# Patient Record
Sex: Male | Born: 2000 | Race: White | Hispanic: No | Marital: Single | Smoking: Never smoker
Health system: Southern US, Community
[De-identification: ages and names within clinical notes are randomized; demographics above are authoritative.]

## PROBLEM LIST (undated history)

## (undated) DIAGNOSIS — U071 COVID-19: Secondary | ICD-10-CM

## (undated) DIAGNOSIS — F419 Anxiety disorder, unspecified: Secondary | ICD-10-CM

## (undated) HISTORY — PX: ELBOW SURGERY: SHX618

## (undated) HISTORY — DX: Anxiety disorder, unspecified: F41.9

---
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
## 2018-05-13 ENCOUNTER — Telehealth: Payer: Self-pay | Admitting: Pediatrics

## 2018-05-13 NOTE — Telephone Encounter (Signed)
[employer] sports form on Dr Eastman Kodakamgoolam's desk

## 2018-05-14 ENCOUNTER — Other Ambulatory Visit: Payer: Self-pay | Admitting: Pediatrics

## 2018-05-14 NOTE — Telephone Encounter (Signed)
Sports form filled 

## 2018-12-30 ENCOUNTER — Encounter: Payer: Self-pay | Admitting: Pediatrics

## 2018-12-30 ENCOUNTER — Ambulatory Visit (INDEPENDENT_AMBULATORY_CARE_PROVIDER_SITE_OTHER): Payer: Commercial Managed Care - PPO | Admitting: Pediatrics

## 2018-12-30 ENCOUNTER — Other Ambulatory Visit: Payer: Self-pay

## 2018-12-30 DIAGNOSIS — Z23 Encounter for immunization: Secondary | ICD-10-CM | POA: Diagnosis not present

## 2018-12-30 NOTE — Progress Notes (Signed)
Discussed with parent about Men B vaccine--parent advised of recommendation and literature given to update parent concerning indications and use of Men B. Parent verbalized understanding. Second dose given.

## 2019-04-05 ENCOUNTER — Emergency Department (HOSPITAL_BASED_OUTPATIENT_CLINIC_OR_DEPARTMENT_OTHER)
Admission: EM | Admit: 2019-04-05 | Discharge: 2019-04-06 | Disposition: A | Discharge status: Home / Self Care | Payer: Commercial Managed Care - PPO | Attending: Emergency Medicine | Admitting: Emergency Medicine

## 2019-04-05 ENCOUNTER — Encounter (HOSPITAL_BASED_OUTPATIENT_CLINIC_OR_DEPARTMENT_OTHER): Payer: Self-pay | Admitting: *Deleted

## 2019-04-05 ENCOUNTER — Other Ambulatory Visit: Payer: Self-pay

## 2019-04-05 DIAGNOSIS — R945 Abnormal results of liver function studies: Secondary | ICD-10-CM | POA: Diagnosis not present

## 2019-04-05 DIAGNOSIS — B349 Viral infection, unspecified: Secondary | ICD-10-CM | POA: Diagnosis not present

## 2019-04-05 DIAGNOSIS — R112 Nausea with vomiting, unspecified: Secondary | ICD-10-CM | POA: Diagnosis present

## 2019-04-05 DIAGNOSIS — R7989 Other specified abnormal findings of blood chemistry: Secondary | ICD-10-CM

## 2019-04-05 DIAGNOSIS — Z79899 Other long term (current) drug therapy: Secondary | ICD-10-CM | POA: Insufficient documentation

## 2019-04-05 MED ORDER — ONDANSETRON HCL 4 MG/2ML IJ SOLN
4.0000 mg | Freq: Once | INTRAMUSCULAR | Status: AC
  Administered 2019-04-06: 4 mg via INTRAVENOUS
  Filled 2019-04-05: qty 2

## 2019-04-05 NOTE — ED Triage Notes (Signed)
Pt reports generalized abdominal pain since last week with diarrhea and vomiting. He was seen at Urgent Care today and told that he was constipated

## 2019-04-05 NOTE — ED Notes (Signed)
Reports 2 doses miralax and another laxative that he cannot recall. States 2 doses of "melting laxatives." Reports taking all these medications today.

## 2019-04-06 ENCOUNTER — Emergency Department (HOSPITAL_BASED_OUTPATIENT_CLINIC_OR_DEPARTMENT_OTHER): Payer: Commercial Managed Care - PPO

## 2019-04-06 LAB — CBC WITH DIFFERENTIAL/PLATELET
Abs Immature Granulocytes: 0.03 10*3/uL (ref 0.00–0.07)
Basophils Absolute: 0 10*3/uL (ref 0.0–0.1)
Basophils Relative: 1 %
Eosinophils Absolute: 0 10*3/uL (ref 0.0–0.5)
Eosinophils Relative: 0 %
HCT: 47.7 % (ref 39.0–52.0)
Hemoglobin: 16.1 g/dL (ref 13.0–17.0)
Immature Granulocytes: 0 %
Lymphocytes Relative: 15 %
Lymphs Abs: 1.2 10*3/uL (ref 0.7–4.0)
MCH: 29.4 pg (ref 26.0–34.0)
MCHC: 33.8 g/dL (ref 30.0–36.0)
MCV: 87.2 fL (ref 80.0–100.0)
Monocytes Absolute: 1.4 10*3/uL — ABNORMAL HIGH (ref 0.1–1.0)
Monocytes Relative: 17 %
Neutro Abs: 5.6 10*3/uL (ref 1.7–7.7)
Neutrophils Relative %: 67 %
Platelets: 247 10*3/uL (ref 150–400)
RBC: 5.47 MIL/uL (ref 4.22–5.81)
RDW: 12.4 % (ref 11.5–15.5)
WBC: 8.3 10*3/uL (ref 4.0–10.5)
nRBC: 0 % (ref 0.0–0.2)

## 2019-04-06 LAB — URINALYSIS, ROUTINE W REFLEX MICROSCOPIC
Bilirubin Urine: NEGATIVE
Glucose, UA: NEGATIVE mg/dL
Hgb urine dipstick: NEGATIVE
Ketones, ur: NEGATIVE mg/dL
Leukocytes,Ua: NEGATIVE
Nitrite: NEGATIVE
Protein, ur: NEGATIVE mg/dL
Specific Gravity, Urine: >1.03 — ABNORMAL HIGH (ref 1.005–1.030)
pH: 5.5 (ref 5.0–8.0)

## 2019-04-06 LAB — COMPREHENSIVE METABOLIC PANEL
ALT: 88 U/L — ABNORMAL HIGH (ref 0–44)
AST: 44 U/L — ABNORMAL HIGH (ref 15–41)
Albumin: 4.7 g/dL (ref 3.5–5.0)
Alkaline Phosphatase: 59 U/L (ref 38–126)
Anion gap: 12 (ref 5–15)
BUN: 12 mg/dL (ref 6–20)
CO2: 24 mmol/L (ref 22–32)
Calcium: 9.1 mg/dL (ref 8.9–10.3)
Chloride: 101 mmol/L (ref 98–111)
Creatinine, Ser: 1.17 mg/dL (ref 0.61–1.24)
GFR calc Af Amer: >60 mL/min (ref >60)
GFR calc non Af Amer: >60 mL/min (ref >60)
Glucose, Bld: 102 mg/dL — ABNORMAL HIGH (ref 70–99)
Potassium: 3.6 mmol/L (ref 3.5–5.1)
Sodium: 137 mmol/L (ref 135–145)
Total Bilirubin: 0.4 mg/dL (ref 0.3–1.2)
Total Protein: 7.6 g/dL (ref 6.5–8.1)

## 2019-04-06 LAB — LIPASE, BLOOD: Lipase: 27 U/L (ref 11–51)

## 2019-04-06 MED ORDER — ONDANSETRON 4 MG PO TBDP: 4.0000 mg | ORAL_TABLET | Freq: Three times a day (TID) | ORAL | 0 refills | Status: DC | PRN

## 2019-04-06 NOTE — Discharge Instructions (Addendum)
Seen today for abdominal pain and vomiting.  Your work-up is largely reassuring.  Your LFTs are very mildly elevated but this can be seen in a viral illness.  Take Zofran as needed for vomiting.  Follow-up with your primary doctor for recheck liver function test.

## 2019-04-06 NOTE — ED Provider Notes (Signed)
[facility] EMERGENCY DEPARTMENT Provider Note   CSN: [id number] Arrival date & time: 04/05/19  2312    History   Chief Complaint Chief Complaint  Patient presents with  . Abdominal Pain    HPI Lucas Sherman is a 18 y.o. male.     HPI  This is a 18 year old male who presents with abdominal pain, nausea, vomiting.  Patient reports 3 to 4-day history of crampy diffuse abdominal pain.  States it is worse after eating.  He states he has not eaten much.  He has had episodes of nonbilious, nonbloody emesis.  He states he has not had a normal bowel movement in 3 to 4 days.  He was seen in urgent care earlier today and told that he had constipation.  7 out of 10.  Patient reports he has taken multiple laxatives but has not had a good bowel movement.  He denies urinary symptoms.  Had not noted any chills or fever but was noted to have a temperature of 100.5 upon arrival.  No known sick contacts.  Denies upper respiratory symptoms, cough, congestion.  History reviewed. No pertinent past medical history.  Patient Active Problem List   Diagnosis Date Noted  . Sore throat 11/15/2017  . Viral syndrome 11/15/2017  . Encounter for routine child health examination without abnormal findings 10/26/2016  . BMI (body mass index), pediatric, > 99% for age 71/24/2017  . BMI (body mass index), pediatric, 95-99% for age 18/11/2013  . Well child check 08/04/2013  . Anxiety disorder 02/03/2013  . Acne vulgaris 07/22/2012  . 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 ELBOW SURGERY          Home Medications    Prior to Admission medications   Medication Sig Start Date End Date Taking? Authorizing Provider  Adapalene-Benzoyl Peroxide (EPIDUO) 0.1-2.5 % gel Apply 1 application topically at bedtime. 07/07/15   Georgiann Hahnamgoolam, Andres, MD  clobetasol ointment (TEMOVATE) 0.05 %  05/22/12   [provider]  escitalopram  (LEXAPRO) 20 MG tablet TAKE 1 TABLET DAILY 05/14/18   Georgiann Hahnamgoolam, Andres, MD  fluticasone (FLONASE) 50 MCG/ACT nasal spray Place 1 spray into both nostrils daily. 07/07/15 07/06/16  Georgiann Hahnamgoolam, Andres, MD  glycopyrrolate (ROBINUL) 1 MG tablet  05/20/12   [provider]  ondansetron (ZOFRAN ODT) 4 MG disintegrating tablet Take 1 tablet (4 mg total) by mouth every 8 (eight) hours as needed. 04/06/19   , Mayer Maskerourtney F, MD  TACLONEX external suspension  05/21/12   [provider]    Family History Family History  Problem Relation Age of Onset  . Arthritis Neg Hx   . Asthma Neg Hx   . Alcohol abuse Neg Hx   . Birth defects Neg Hx   . Cancer Neg Hx   . COPD Neg Hx   . Depression Neg Hx   . Diabetes Neg Hx   . Drug abuse Neg Hx   . Early death Neg Hx   . Hearing loss Neg Hx   . Heart disease Neg Hx   . Hyperlipidemia Neg Hx   . Learning disabilities Neg Hx   . Kidney disease Neg Hx   . Hypertension Neg Hx   . Mental illness Neg Hx   . Miscarriages / Stillbirths Neg Hx   . Mental retardation Neg Hx   . Stroke Neg Hx   . Vision loss Neg Hx   . Varicose Veins Neg Hx  Social History Social History   Tobacco Use  . Smoking status: Never Smoker  . Smokeless tobacco: Never Used  Substance Use Topics  . Alcohol use: Yes    Comment: occasional  . Drug use: Never     Allergies   Patient has no known allergies.   Review of Systems Review of Systems  Constitutional: Positive for fever.  Respiratory: Negative for cough and shortness of breath.   Cardiovascular: Negative for chest pain.  Gastrointestinal: Positive for abdominal pain, constipation, nausea and vomiting. Negative for diarrhea.  Genitourinary: Negative for dysuria.  Neurological: Negative for weakness and light-headedness.  All other systems reviewed and are negative.    Physical Exam Updated Vital Signs BP (!) 143/71 (BP Location: Right Arm)   Pulse 99   Temp (!) 100.5 F (38.1 C) (Oral)    Resp 20   Ht 1.753 m (5\' 9" )   Wt 99.8 kg   SpO2 100%   BMI 32.49 kg/m   Physical Exam Vitals signs and nursing note reviewed.  Constitutional:      Appearance: He is well-developed.     Comments: Overweight  HENT:     Head: Normocephalic and atraumatic.     Mouth/Throat:     Mouth: Mucous membranes are moist.  Eyes:     Pupils: Pupils are equal, round, and reactive to light.  Neck:     Musculoskeletal: Neck supple.  Cardiovascular:     Rate and Rhythm: Normal rate and regular rhythm.     Heart sounds: Normal heart sounds. No murmur.  Pulmonary:     Effort: Pulmonary effort is normal. No respiratory distress.     Breath sounds: Normal breath sounds. No wheezing.  Abdominal:     General: Bowel sounds are normal.     Palpations: Abdomen is soft.     Tenderness: There is no abdominal tenderness. There is no guarding or rebound.  Lymphadenopathy:     Cervical: No cervical adenopathy.  Skin:    General: Skin is warm and dry.  Neurological:     Mental Status: He is alert and oriented to person, place, and time.  Psychiatric:        Mood and Affect: Mood normal.      ED Treatments / Results  Labs (all labs ordered are listed, but only abnormal results are displayed) Labs Reviewed  CBC WITH DIFFERENTIAL/PLATELET - Abnormal; Notable for the following components:      Result Value   Monocytes Absolute 1.4 (*)    All other components within normal limits  COMPREHENSIVE METABOLIC PANEL - Abnormal; Notable for the following components:   Glucose, Bld 102 (*)    AST 44 (*)    ALT 88 (*)    All other components within normal limits  URINALYSIS, ROUTINE W REFLEX MICROSCOPIC - Abnormal; Notable for the following components:   Specific Gravity, Urine >1.030 (*)    All other components within normal limits  LIPASE, BLOOD    EKG None  Radiology Dg Abdomen 1 View  Result Date: 04/06/2019 CLINICAL DATA:  18 y/o M; 3 days of abdominal pain. Last normal bowel movement was 5  days ago. EXAM: ABDOMEN - 1 VIEW COMPARISON:  None. FINDINGS: The bowel gas pattern is normal. No radio-opaque calculi or other significant radiographic abnormality are seen. Mild dextrocurvature of the lumbar spine. IMPRESSION: Negative. Electronically Signed   By: Kristine Garbe M.D.   On: 04/06/2019 01:21    Procedures Procedures (including critical care time)  Medications  Ordered in ED Medications  ondansetron (ZOFRAN) injection 4 mg (4 mg Intravenous Given 04/06/19 0010)     Initial Impression / Assessment and Plan / ED Course  I have reviewed the triage vital signs and the nursing notes.  Pertinent labs & imaging results that were available during my care of the patient were reviewed by me and considered in my medical decision making (see chart for details).        Patient presents with abdominal pain, nausea, vomiting.  Told he was constipated and has taken laxative.  He is overall nontoxic-appearing.  Temperature is 100.5.  Denies any other infectious symptoms.  His abdominal exam is fairly benign.  No focal tenderness or signs of peritonitis.  Considerations include viral etiology, gastritis, pancreatitis, cholecystitis, early appendicitis.  Lab work obtained.  Patient was given Zofran.  No significant leukocytosis.  CMP notable for very mildly elevated AST and LFT.  This is non-specific.  X-ray does not show any evidence of significant constipation.  Patient did have 1 diarrhea bowel movement while in the emergency department and reports that he feels somewhat better.  Given constellation of symptoms, I suspect this is most likely viral in nature.  His abdominal exam does not have any focal tenderness to suggest cholecystitis or appendicitis at this time.  I discussed this with the patient.  Recommend supportive measures and follow-up for repeat LFTs.  After history, exam, and medical workup I feel the patient has been appropriately medically screened and is safe for  discharge home. Pertinent diagnoses were discussed with the patient. Patient was given return precautions.  Final Clinical Impressions(s) / ED Diagnoses   Final diagnoses:  Non-intractable vomiting with nausea, unspecified vomiting type  Elevated LFTs  Viral syndrome    ED Discharge Orders         Ordered    ondansetron (ZOFRAN ODT) 4 MG disintegrating tablet  Every 8 hours PRN     04/06/19 0140           , Mayer Maskerourtney F, MD 04/06/19 0147

## 2019-04-08 ENCOUNTER — Emergency Department (HOSPITAL_BASED_OUTPATIENT_CLINIC_OR_DEPARTMENT_OTHER): Payer: Commercial Managed Care - PPO

## 2019-04-08 ENCOUNTER — Other Ambulatory Visit: Payer: Self-pay

## 2019-04-08 ENCOUNTER — Emergency Department (HOSPITAL_BASED_OUTPATIENT_CLINIC_OR_DEPARTMENT_OTHER)
Admission: EM | Admit: 2019-04-08 | Discharge: 2019-04-08 | Disposition: A | Discharge status: Home / Self Care | Payer: Commercial Managed Care - PPO | Attending: Emergency Medicine | Admitting: Emergency Medicine

## 2019-04-08 DIAGNOSIS — R112 Nausea with vomiting, unspecified: Secondary | ICD-10-CM | POA: Insufficient documentation

## 2019-04-08 DIAGNOSIS — R1084 Generalized abdominal pain: Secondary | ICD-10-CM | POA: Insufficient documentation

## 2019-04-08 DIAGNOSIS — R109 Unspecified abdominal pain: Secondary | ICD-10-CM | POA: Diagnosis present

## 2019-04-08 DIAGNOSIS — R1011 Right upper quadrant pain: Secondary | ICD-10-CM | POA: Diagnosis not present

## 2019-04-08 LAB — CBC WITH DIFFERENTIAL/PLATELET
Abs Immature Granulocytes: 0.02 10*3/uL (ref 0.00–0.07)
Basophils Absolute: 0 10*3/uL (ref 0.0–0.1)
Basophils Relative: 0 %
Eosinophils Absolute: 0 10*3/uL (ref 0.0–0.5)
Eosinophils Relative: 0 %
HCT: 46.5 % (ref 39.0–52.0)
Hemoglobin: 15.7 g/dL (ref 13.0–17.0)
Immature Granulocytes: 0 %
Lymphocytes Relative: 17 %
Lymphs Abs: 1.1 10*3/uL (ref 0.7–4.0)
MCH: 29.3 pg (ref 26.0–34.0)
MCHC: 33.8 g/dL (ref 30.0–36.0)
MCV: 86.8 fL (ref 80.0–100.0)
Monocytes Absolute: 0.6 10*3/uL (ref 0.1–1.0)
Monocytes Relative: 10 %
Neutro Abs: 4.5 10*3/uL (ref 1.7–7.7)
Neutrophils Relative %: 73 %
Platelets: 255 10*3/uL (ref 150–400)
RBC: 5.36 MIL/uL (ref 4.22–5.81)
RDW: 12 % (ref 11.5–15.5)
WBC: 6.3 10*3/uL (ref 4.0–10.5)
nRBC: 0 % (ref 0.0–0.2)

## 2019-04-08 LAB — COMPREHENSIVE METABOLIC PANEL
ALT: 57 U/L — ABNORMAL HIGH (ref 0–44)
AST: 29 U/L (ref 15–41)
Albumin: 4.6 g/dL (ref 3.5–5.0)
Alkaline Phosphatase: 51 U/L (ref 38–126)
Anion gap: 9 (ref 5–15)
BUN: 8 mg/dL (ref 6–20)
CO2: 26 mmol/L (ref 22–32)
Calcium: 9 mg/dL (ref 8.9–10.3)
Chloride: 102 mmol/L (ref 98–111)
Creatinine, Ser: 1.07 mg/dL (ref 0.61–1.24)
GFR calc Af Amer: >60 mL/min (ref >60)
GFR calc non Af Amer: >60 mL/min (ref >60)
Glucose, Bld: 100 mg/dL — ABNORMAL HIGH (ref 70–99)
Potassium: 3.6 mmol/L (ref 3.5–5.1)
Sodium: 137 mmol/L (ref 135–145)
Total Bilirubin: 0.7 mg/dL (ref 0.3–1.2)
Total Protein: 7.4 g/dL (ref 6.5–8.1)

## 2019-04-08 LAB — URINALYSIS, ROUTINE W REFLEX MICROSCOPIC
Bilirubin Urine: NEGATIVE
Glucose, UA: NEGATIVE mg/dL
Hgb urine dipstick: NEGATIVE
Ketones, ur: NEGATIVE mg/dL
Leukocytes,Ua: NEGATIVE
Nitrite: NEGATIVE
Protein, ur: NEGATIVE mg/dL
Specific Gravity, Urine: 1.02 (ref 1.005–1.030)
pH: 6.5 (ref 5.0–8.0)

## 2019-04-08 LAB — LIPASE, BLOOD: Lipase: 25 U/L (ref 11–51)

## 2019-04-08 MED ORDER — LACTATED RINGERS IV BOLUS
1000.0000 mL | Freq: Once | INTRAVENOUS | Status: AC
  Administered 2019-04-08: 1000 mL via INTRAVENOUS

## 2019-04-08 MED ORDER — DIPHENHYDRAMINE HCL 50 MG/ML IJ SOLN
25.0000 mg | Freq: Once | INTRAMUSCULAR | Status: AC
  Administered 2019-04-08: 25 mg via INTRAVENOUS
  Filled 2019-04-08: qty 1

## 2019-04-08 MED ORDER — HYDROXYZINE HCL 25 MG PO TABS
25.0000 mg | ORAL_TABLET | Freq: Once | ORAL | Status: AC
  Administered 2019-04-08: 25 mg via ORAL
  Filled 2019-04-08: qty 1

## 2019-04-08 MED ORDER — FAMOTIDINE IN NACL 20-0.9 MG/50ML-% IV SOLN
20.0000 mg | Freq: Once | INTRAVENOUS | Status: AC
  Administered 2019-04-08: 20 mg via INTRAVENOUS
  Filled 2019-04-08: qty 50

## 2019-04-08 MED ORDER — METOCLOPRAMIDE HCL 5 MG/ML IJ SOLN
10.0000 mg | Freq: Once | INTRAMUSCULAR | Status: AC
  Administered 2019-04-08: 10 mg via INTRAVENOUS
  Filled 2019-04-08: qty 2

## 2019-04-08 NOTE — Discharge Instructions (Addendum)
Please see the information and instructions below regarding your visit.  Your diagnoses today include:  1. Non-intractable vomiting with nausea, unspecified vomiting type   2. RUQ pain   3. Generalized abdominal pain     Your exam and testing today is reassuring that there is not a condition causing your abdominal pain that we immediately need to intervene on at this time.   Abdominal (belly) pain can be caused by many things. Your caregiver performed an examination and possibly ordered blood/urine tests and imaging (CT scan, x-rays, ultrasound). Many cases can be observed and treated at home after initial evaluation in the emergency department. Even though you are being discharged home, abdominal pain can be unpredictable. Therefore, you need a repeated exam if your pain does not resolve, returns, or worsens. Most patients with abdominal pain don't have to be admitted to the hospital or have surgery, but serious problems like appendicitis and gallbladder attacks can start out as nonspecific pain. Many abdominal conditions cannot be diagnosed in one visit, so follow-up evaluations are very important.  Tests performed today include: Blood counts and electrolytes Blood tests to check liver and kidney function Blood tests to check pancreas function Urine test to look for infection and pregnancy (in women) Vital signs. See below for your results today.   See side panel of your discharge paperwork for testing performed today. Vital signs are listed at the bottom of these instructions.   I will call you regarding the results of the ultrasound.  If there is evidence of a gallbladder infection, I will recommend that you go to [facility].  Medications prescribed:    Take any prescribed medications only as prescribed, and any over the counter medications only as directed on the packaging.  Continue to take home Zofran.  Home care instructions:  Try eating, but start with foods that have  a lot of fluid in them. Good examples are soup, Jell-O, and popsicles. If you do OK with those foods, you can try soft, bland foods, such as plain yogurt. Foods that are high in carbohydrates ("carbs"), like bread or saltine crackers, can help settle your stomach. Some people also find that ginger helps with nausea. You should avoid foods that have a lot of fat in them. They can make nausea worse. Call your doctor if your symptoms come back when you try to eat.  Please follow any educational materials contained in this packet.   Follow-up instructions: Please follow-up with your primary care provider.  You only need to make an appointment with [facility] [place] [facility] if your ultrasound shows gallstones.  Return instructions:  Please return to the Emergency Department if you experience worsening symptoms.  SEEK IMMEDIATE MEDICAL ATTENTION IF: The pain does not go away or becomes severe  A temperature above 101F develops  Repeated vomiting occurs (multiple episodes)  The pain becomes localized to portions of the abdomen. The right side could possibly be appendicitis. In an adult, the left lower portion of the abdomen could be colitis or diverticulitis.  Blood is being passed in stools or vomit (bright red or black tarry stools)  You develop chest pain, difficulty breathing, dizziness or fainting, or become confused, poorly responsive, or inconsolable (young children) If you have any other emergent concerns regarding your health  Additional Information:   Your vital signs today were: BP 133/81 (BP Location: Right Arm)    Pulse 75    Temp 98.3 F (36.8 C) (Oral)    Resp 18  Ht 5\' 9"  (1.753 m)    Wt 100.7 kg    SpO2 100%    BMI 32.78 kg/m  If your blood pressure (BP) was elevated on multiple readings during this visit above 130 for the top number or above 80 for the bottom number, please have this repeated by your primary care provider within one month. --------------  Thank you for  allowing [place] to participate in your care today.

## 2019-04-08 NOTE — ED Provider Notes (Signed)
[facility] EMERGENCY DEPARTMENT Provider Note   CSN: [id number] Arrival date & time: 04/08/19  1754     History   Chief Complaint Chief Complaint  Patient presents with  . Abdominal Pain    HPI Lucas Sherman is a 18 y.o. male.     HPI   Patient is a 18 year old male with a past medical history of generalized anxiety, obesity, presenting for generalized abdominal pain, nausea, vomiting.  Patient reports that his symptoms have been present for 1 week.  He had a fever 2 days ago that is since resolved.  Pain has now localized more to the right upper quadrant.  It is intermittent and sharp.  He cannot identify any specific thing that makes it worse or better.  He reports that he can keep fluids down but foods make him vomit.  Denies bilious or bloody vomiting.  Patient reports soft stool without diarrhea.  Denies melena or hematochezia.  No abdominal surgical history.  Patient denies any known exposures to individuals with COVID-19.  No past medical history on file.  Patient Active Problem List   Diagnosis Date Noted  . Sore throat 11/15/2017  . Viral syndrome 11/15/2017  . Encounter for routine child health examination without abnormal findings 10/26/2016  . BMI (body mass index), pediatric, > 99% for age 27/24/2017  . BMI (body mass index), pediatric, 95-99% for age 40/11/2013  . Well child check 08/04/2013  . Anxiety disorder 02/03/2013  . Acne vulgaris 07/22/2012  . 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 ELBOW SURGERY          Home Medications    Prior to Admission medications   Medication Sig Start Date End Date Taking? Authorizing Provider  Adapalene-Benzoyl Peroxide (EPIDUO) 0.1-2.5 % gel Apply 1 application topically at bedtime. 07/07/15   Marcha Solders, MD  clobetasol ointment (TEMOVATE) 0.05 %  05/22/12   [provider]  escitalopram (LEXAPRO) 20 MG tablet TAKE 1  TABLET DAILY 05/14/18   Marcha Solders, MD  fluticasone (FLONASE) 50 MCG/ACT nasal spray Place 1 spray into both nostrils daily. 07/07/15 07/06/16  Marcha Solders, MD  glycopyrrolate (ROBINUL) 1 MG tablet  05/20/12   [provider]  ondansetron (ZOFRAN ODT) 4 MG disintegrating tablet Take 1 tablet (4 mg total) by mouth every 8 (eight) hours as needed. 04/06/19   Horton, Barbette Hair, MD  TACLONEX external suspension  05/21/12   [provider]    Family History Family History  Problem Relation Age of Onset  . Arthritis Neg Hx   . Asthma Neg Hx   . Alcohol abuse Neg Hx   . Birth defects Neg Hx   . Cancer Neg Hx   . COPD Neg Hx   . Depression Neg Hx   . Diabetes Neg Hx   . Drug abuse Neg Hx   . Early death Neg Hx   . Hearing loss Neg Hx   . Heart disease Neg Hx   . Hyperlipidemia Neg Hx   . Learning disabilities Neg Hx   . Kidney disease Neg Hx   . Hypertension Neg Hx   . Mental illness Neg Hx   . Miscarriages / Stillbirths Neg Hx   . Mental retardation Neg Hx   . Stroke Neg Hx   . Vision loss Neg Hx   . Varicose Veins Neg Hx     Social History Social History   Tobacco Use  .  Smoking status: Never Smoker  . Smokeless tobacco: Never Used  Substance Use Topics  . Alcohol use: Yes    Comment: occasional  . Drug use: Never     Allergies   Patient has no known allergies.   Review of Systems Review of Systems  Constitutional: Negative for chills and fever.  HENT: Negative for congestion, rhinorrhea, sinus pain and sore throat.   Eyes: Negative for visual disturbance.  Respiratory: Negative for cough, chest tightness and shortness of breath.   Cardiovascular: Negative for chest pain.  Gastrointestinal: Positive for abdominal pain, nausea and vomiting. Negative for constipation and diarrhea.  Genitourinary: Negative for dysuria and flank pain.  Musculoskeletal: Negative for back pain and myalgias.  Skin: Negative for rash.  Neurological: Negative  for dizziness, syncope, light-headedness and headaches.     Physical Exam Updated Vital Signs BP 132/74 (BP Location: Right Arm)   Pulse 72   Temp 98.3 F (36.8 C) (Oral)   Resp 16   Ht 5\' 9"  (1.753 m)   Wt 100.7 kg   SpO2 99%   BMI 32.78 kg/m   Physical Exam Vitals signs and nursing note reviewed.  Constitutional:      General: He is not in acute distress.    Appearance: He is well-developed.  HENT:     Head: Normocephalic and atraumatic.  Eyes:     Conjunctiva/sclera: Conjunctivae normal.     Pupils: Pupils are equal, round, and reactive to light.  Neck:     Musculoskeletal: Normal range of motion and neck supple.  Cardiovascular:     Rate and Rhythm: Normal rate and regular rhythm.     Heart sounds: S1 normal and S2 normal. No murmur.  Pulmonary:     Effort: Pulmonary effort is normal.     Breath sounds: Normal breath sounds. No wheezing or rales.  Abdominal:     General: Bowel sounds are normal. There is no distension.     Palpations: Abdomen is soft.     Tenderness: There is generalized abdominal tenderness and tenderness in the right upper quadrant and epigastric area. There is no guarding.  Musculoskeletal: Normal range of motion.        General: No deformity.  Lymphadenopathy:     Cervical: No cervical adenopathy.  Skin:    General: Skin is warm and dry.     Findings: No erythema or rash.  Neurological:     Mental Status: He is alert.     Comments: Cranial nerves grossly intact. Patient moves extremities symmetrically and with good coordination.  Psychiatric:        Behavior: Behavior normal.        Thought Content: Thought content normal.        Judgment: Judgment normal.      ED Treatments / Results  Labs (all labs ordered are listed, but only abnormal results are displayed) Labs Reviewed  COMPREHENSIVE METABOLIC PANEL - Abnormal; Notable for the following components:      Result Value   Glucose, Bld 100 (*)    ALT 57 (*)    All other  components within normal limits  CBC WITH DIFFERENTIAL/PLATELET  LIPASE, BLOOD  URINALYSIS, ROUTINE W REFLEX MICROSCOPIC    EKG None  Radiology [place] Abdomen Limited Ruq  Result Date: 04/08/2019 CLINICAL DATA:  Right upper quadrant pain EXAM: ULTRASOUND ABDOMEN LIMITED RIGHT UPPER QUADRANT COMPARISON:  None. FINDINGS: Gallbladder: No gallstones or wall thickening visualized. There is no pericholecystic fluid. No sonographic Murphy sign noted by  sonographer. Common bile duct: Diameter: 2 mm. No intrahepatic or extrahepatic biliary duct dilatation. Liver: No focal lesion identified. Within normal limits in parenchymal echogenicity. Portal vein is patent on color Doppler imaging with normal direction of blood flow towards the liver. IMPRESSION: Study within normal limits. Electronically Signed   By: Bretta BangWilliam  Woodruff III M.D.   On: 04/08/2019 20:59    Procedures Procedures (including critical care time)  Medications Ordered in ED Medications  lactated ringers bolus 1,000 mL ( Intravenous Stopped 04/08/19 2000)  metoCLOPramide (REGLAN) injection 10 mg (10 mg Intravenous Given 04/08/19 1902)  diphenhydrAMINE (BENADRYL) injection 25 mg (25 mg Intravenous Given 04/08/19 1901)  famotidine (PEPCID) IVPB 20 mg premix ( Intravenous Stopped 04/08/19 1939)  hydrOXYzine (ATARAX/VISTARIL) tablet 25 mg (25 mg Oral Given 04/08/19 1948)     Initial Impression / Assessment and Plan / ED Course  I have reviewed the triage vital signs and the nursing notes.  Pertinent labs & imaging results that were available during my care of the patient were reviewed by me and considered in my medical decision making (see chart for details).  Clinical Course as of Apr 07 2209  Wed Apr 08, 2019  2036 Patient reports he cannot stay any longer.  He reports that he feels much better from his abdominal complaints however he is very jittery.  Reports that the Atarax did not help.  Offered anxiolysis, however he reports that he would  like to go home.  He talk with his father is amenable to this plan.  I will notify him via phone of the results of his right upper quadrant ultrasound.  He was instructed if there is an infection in the gallbladder, I recommend that he [facility].  He will be notified.   [AM]  2154 Attempted to call pt and give results. Message not containing health information.    [AM]    Clinical Course User Index [AM] Elisha PonderMurray, Caley Volkert B, PA-C      This is a well-appearing 18 year old male with past medical history of anxiety, obesity presenting for generalized abdominal pains localizing to the right upper quadrant, nausea, and vomiting.  Patient was febrile 2 days ago however this is resolved without antipyretics.  Differential diagnosis includes cholecystitis, cholelithiasis, gastroenteritis, cannabis hyperemesis, cyclical vomiting syndrome, IBS, IBD.  Work-up demonstrated leukocytosis.  Patient has improvement in transaminitis seen a couple days ago.  He only has a slight elevation ALT at 57.  Urinalysis clear without evidence of infection.  Lipase not elevated.  Right upper quadrant ultrasound within normal limits.  He continues have benign abdominal exam.    Patient reports feeling much better after fluids, Pepcid, antiemetics, however he did report significant feelings of anxiousness after receiving Reglan despite the Benadryl and Atarax administration.  Tolerating PO. He reports he could not wait for the results of his right upper quad ultrasound.  Attempted to reach patient x1 to give normal results.  He was also instructed to follow-up on my chart.  Please instructed return for any new or worsening symptoms.  Patient is in understanding and agrees with the plan of care.  Final Clinical Impressions(s) / ED Diagnoses   Final diagnoses:  RUQ pain  Non-intractable vomiting with nausea, unspecified vomiting type  Generalized abdominal pain    ED Discharge Orders    None       Delia ChimesMurray, Angelik Walls B,  PA-C 04/08/19 2211    Virgina Norfolkuratolo, Adam, DO 04/08/19 2247

## 2019-04-08 NOTE — ED Triage Notes (Signed)
Pt reports abdominal pain generalized cramping.  Was seen for same two days ago, given zofran and discharged home.  Pt states since then pain has been constant, unable to sleep, vomiting intermittently.  Able to keep down fluids well, eating bland foods.  Denies any urination difficulties. Denies fevers.

## 2019-04-08 NOTE — ED Notes (Signed)
Provider at bedside.  Pt aware of need for urine specimen

## 2019-04-21 ENCOUNTER — Ambulatory Visit: Payer: Commercial Managed Care - PPO | Admitting: Psychiatry

## 2019-05-04 ENCOUNTER — Other Ambulatory Visit: Payer: Self-pay

## 2019-05-04 ENCOUNTER — Ambulatory Visit (INDEPENDENT_AMBULATORY_CARE_PROVIDER_SITE_OTHER): Payer: Commercial Managed Care - PPO | Admitting: Psychiatry

## 2019-05-04 ENCOUNTER — Encounter: Payer: Self-pay | Admitting: Psychiatry

## 2019-05-04 VITALS — Ht 67.5 in | Wt 217.0 lb

## 2019-05-04 DIAGNOSIS — F410 Panic disorder [episodic paroxysmal anxiety] without agoraphobia: Secondary | ICD-10-CM | POA: Diagnosis not present

## 2019-05-04 DIAGNOSIS — G2581 Restless legs syndrome: Secondary | ICD-10-CM | POA: Diagnosis not present

## 2019-05-04 DIAGNOSIS — F411 Generalized anxiety disorder: Secondary | ICD-10-CM

## 2019-05-04 MED ORDER — GABAPENTIN 100 MG PO CAPS: 200.0000 mg | ORAL_CAPSULE | Freq: Two times a day (BID) | ORAL | 0 refills | Status: DC

## 2019-05-04 MED ORDER — CLONAZEPAM 0.5 MG PO TABS: 0.5000 mg | ORAL_TABLET | Freq: Three times a day (TID) | ORAL | 0 refills | Status: DC | PRN

## 2019-05-04 NOTE — Progress Notes (Signed)
[facility] MD/PA/NP Initial Note  05/04/2019 11:58 PM Lucas Sherman  MRN:  [id number] PCP: Lucas LennertSarah Weber, PA at [facility] Time spent 45 minutes from [phone]  Chief Complaint:  Chief Complaint    Anxiety; Panic Attack      HPI: Lucas Sherman is seen onsite in office face-to-face individually with consent with epic collateral scheduled by mother who does not attend today but predicted patient's arrival for care similar to younger brother patient reports is doing well now on Klonopin alone having difficulty with at least 4 different antianxiety antidepressants.  Asberry's medication management has been by primary care, and he seems anxious and stressed to be here today talking rapidly stressing issues and conclusions in a pattern that likely occurs with panic and generalized anxiety for him.  He has performance anxiety that dates back to latency years if not earlier.  His anxiety reaches out-of-control proportions in the last 4-5 years with panic of gastrointestinal origin.  He has been to the emergency department July 5th and 8th with intractable vomiting having elevation of ALT to 88 considered viral gastritis now returned to normal with follow-up at [facility] with ALT back to normal 04/27/2019 at 43.  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  He has not been suspected of ADHD or bipolar hypomania in the past.  His anxiety can be depressing for a brief time but depression never becomes sustained or primary.  PCP has recently added Klonopin 0.5 mg twice daily as needed patient taking it twice daily finding some improvement similar to younger brother doing the same.  The patient has been on Lexapro 20 mg every morning for 8 to 10 years finding that he cannot stop it because he gets brain zaps stating it has helped variously at  different times but concluding he would definitely do poorly off of it.  He did not tolerate Pristiq 25 or 50 mg having headaches and other side effects and is generally apprehensive about any other treatment.  Does describe difficulty sleeping stating that he must stretch his legs before getting in bed but his legs will often cause him to arise from bed quickly to take a hot bath to settle his legs.  He has limited sleep time though not always fatigued the next day, though possibly more predisposed to anxiety.  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  He notes he has palpitations on waking in the morning as though pre-panic symptoms.  He emphasizes he has completed high school and is doing reasonably well in college anticipating he can transfer to [employer], [employer] or [employer] to play baseball  and he may be drafted into the pros.  He knows that gastrointestinal problems extended even to infancy crying and spitting up until he was held.  He has no mania, suicidality, psychosis, or delirium.  He has no substance use diathesis.  Visit Diagnosis:    ICD-10-CM   1. Generalized anxiety disorder  F41.1 clonazePAM (KLONOPIN) 0.5 MG tablet  2. Panic disorder  F41.0 gabapentin (NEURONTIN) 100 MG capsule    clonazePAM (KLONOPIN) 0.5 MG tablet  3. Restless legs syndrome  G25.81 gabapentin (NEURONTIN) 100 MG capsule  4.      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  He  has primarily been treated by primary care with Lexapro titrated up to 20 mg every morning having recently been treated with Pristiq 25 or 50 mg daily not tolerated due to headache and other side effects.  He does feel better with Klonopin 0.5 mg twice daily whether as needed as PCP suggests or whether in a prevention way on schedule.  He seeks some modification in treatment for success like his younger  brother.  Past Medical History:  Past Medical History:  Diagnosis Date  . Anxiety   Pustular palmar and plantar psoriasis  Past Surgical History:  Procedure Laterality Date  . ELBOW SURGERY    Lucas Sherman John surgery right elbow as a baseball pitcher  Hepatic Function Panel (04/27/2019 9:32 AM EDT) Hepatic Function Panel (04/27/2019 9:32 AM EDT)  Component Value Ref Range Performed At Pathologist Signature  Total Protein 6.9 6.0 - 8.5 g/dL [facility]   Albumin, Serum 4.8 4.1 - 5.2 g/dL [facility]   Total Bilirubin <0.2 0.0 - 1.2 mg/dL [facility]   Bilirubin, Direct 0.09 0.00 - 0.40 mg/dL [facility]   Alkaline Phosphatase 61 39 - 117 IU/L [facility]   AST 40 0 - 40 IU/L [facility]   ALT (SGPT) 43 0 - 44 IU/L [facility]      Family Psychiatric History: Mother has been treated with Paxil then currently Lexapro for anxiety that started postpartum raising question of associated depressive mood disorder.  Maternal grandmother took Remeron for depression and anxiety. Younger brother has taken Lexapro, Remeron, Pamelor and Paxil without success more recently Wellbutrin only tolerating 150 mg XL possibly discontinued and now Klonopin 0.5 mg twice daily.  There is some depression and alcohol use disorder on paternal side and some alcohol use disorder on the maternal side of the family.  Family History:  Family History  Problem Relation Age of Onset  . Depression Mother   . Anxiety disorder Mother   . Depression Maternal Grandmother   . Anxiety disorder Brother   . Panic disorder Brother   . Depression Other   . Alcohol abuse Other   . Arthritis Neg Hx   . Asthma Neg Hx   . Birth defects Neg Hx   . Cancer Neg Hx   . COPD Neg Hx   . Diabetes Neg Hx   . Drug abuse Neg Hx   . Early death Neg Hx   . Hearing loss Neg Hx   . Heart disease Neg Hx   . Hyperlipidemia Neg Hx   . Learning disabilities Neg Hx   . Kidney disease Neg Hx   . Hypertension Neg Hx   . Mental illness Neg Hx   .  Miscarriages / Stillbirths Neg Hx   . Mental retardation Neg Hx   . Stroke Neg Hx   . Vision loss Neg Hx   . Varicose Veins Neg Hx     Social History: Currently at [employer] after graduating at [employer] and transferred from [employer] having middle school at [employer]. Social History   Socioeconomic History  . Marital status: Single    Spouse name: Not on file  . Number of children: Not on file  . Years of education: 4414  . Highest education level: High school graduate  Occupational History  . Occupation: Seeking pro baseball.career  Social Needs  . Financial resource strain: Not on file  . Food insecurity    Worry: Never true    Inability: Never true  .  Transportation needs    Medical: No    Non-medical: No  Tobacco Use  . Smoking status: Never Smoker  . Smokeless tobacco: Never Used  Substance and Sexual Activity  . Alcohol use: Yes    Comment: occasional  . Drug use: Never  . Sexual activity: Not on file  Lifestyle  . Physical activity    Days per week: Not on file    Minutes per session: Not on file  . Stress: Rather much  Relationships  . Social Herbalist on phone: Not on file    Gets together: Not on file    Attends religious service: Not on file    Active member of club or organization: Not on file    Attends meetings of clubs or organizations: Not on file    Relationship status: Not on file  Other Topics Concern  . 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  He describes restless legs in his sleep patterns having to get up for  a hot bath or stretches to his legs in order to try to get back to sleep.  Despite longstanding Lexapro and new onset Klonopin, he fears and finds instability still in his illness pattern that undermines his performance in baseball and academics.  He leaves for college 05/18/2019 seeking intervention prior to then after PCP has recently started the Klonopin which helped but affecting tolerance side effects if dosed higher to prevent anxiety or panic.    Allergies: No Known Allergies  Metabolic Disorder Labs: No results found for: HGBA1C, MPG No results found for: PROLACTIN No results found for: CHOL, TRIG, HDL, CHOLHDL, VLDL, LDLCALC No results found for: TSH  Therapeutic Level Labs: No results found for: LITHIUM No results found for: VALPROATE No components found for:  CBMZ  Current Medications: Current Outpatient Medications  Medication Sig Dispense Refill  . clonazePAM (KLONOPIN) 0.5 MG tablet Take 0.5 mg by mouth 2 (two) times daily as needed.    . Adapalene-Benzoyl Peroxide (EPIDUO) 0.1-2.5 % gel Apply 1 application topically at bedtime. 45 g 12  . clobetasol ointment (TEMOVATE) 0.05 %     . clonazePAM (KLONOPIN) 0.5 MG tablet Take 1 tablet (0.5 mg total) by mouth 3 (three) times daily as needed for anxiety. 90 tablet 0  . escitalopram (LEXAPRO) 20 MG tablet TAKE 1 TABLET DAILY 90 tablet 3  . fluticasone (FLONASE) 50 MCG/ACT nasal spray Place 1 spray into both nostrils daily. 16 g 2  . gabapentin (NEURONTIN) 100 MG capsule Take 2 capsules (200 mg total) by mouth 2 (two) times daily. 120 capsule 0  . glycopyrrolate (ROBINUL) 1 MG tablet     . ondansetron (ZOFRAN ODT) 4 MG disintegrating tablet Take 1 tablet (4 mg total) by mouth every 8 (eight) hours as needed. 20 tablet 0  . TACLONEX external suspension      No current facility-administered medications for this visit.     Medication Side Effects: Discontinuation brain zaps relative to Lexapro  Orders placed this visit:  No  orders of the defined types were placed in this encounter.   Psychiatric Specialty Exam:  Review of Systems  Constitutional:  Obesity BMI 33.5  HENT: Negative.   Eyes: Negative.   Respiratory: Negative.   Cardiovascular: Negative.   Gastrointestinal: Positive for nausea and vomiting.       Diminished appetite  Genitourinary: Negative.   Musculoskeletal: Positive for joint pain.       Ulnar collateral ligament right elbow reconstruction  Skin:       History of palmar-plantar pustular psoriasis  Neurological: Negative.   Endo/Heme/Allergies: Negative.   Psychiatric/Behavioral: Negative for depression, hallucinations, memory loss, substance abuse and suicidal ideas. The patient is nervous/anxious and has insomnia.   Muscle strengths and tone 5/5, postural reflexes and gait 0/0, and AIMS = 0.  AMRs equal 0/0 and cerebellar functions are intact.  He has full range of motion cervical spine.  There are no neurocutaneous stigmata or soft neurologic findings.  PERRLA 3 mm with EOMs intact.  Height 5' 7.5" (1.715 m), weight 217 lb (98.4 kg).Body mass index is 33.49 kg/m.  General Appearance: Casual, Fairly Groomed, Guarded and Obese  Eye Contact:  Good  Speech:  Clear and Coherent, Pressured and Talkative  Volume:  Normal  Mood:  Anxious, Euthymic and Irritable  Affect:  Non-Congruent, Labile, Full Range and Anxious  Thought Process:  Coherent, Goal Directed and Irrelevant  Orientation:  Full (Time, Place, and Person)  Thought Content: Ilusions, Obsessions and Rumination   Suicidal Thoughts:  No  Homicidal Thoughts:  No  Memory:  Immediate;   Good Remote;   Good  Judgement:  Fair  Insight:  Fair  Psychomotor Activity:  Normal, Increased, Mannerisms and Restlessness  Concentration:  Concentration: Fair and Attention Span: Good  Recall:  Good  Fund of Knowledge: Good  Language: Good  Assets:  Desire for Improvement Leisure Time Physical Health Social Support Talents/Skills   ADL's:  Intact  Cognition: WNL  Prognosis:  Good   Screenings:  PHQ2-9     Office Visit from 10/29/2017 in [facility] Office Visit from 10/25/2016 in [facility] Office Visit from 10/25/2015 in [facility] Office Visit from 09/01/2014 in [facility] Office Visit from 08/04/2013 in [facility]  PHQ-2 Total Score  0  0  0  0  0  PHQ-9 Total Score  0  0  0  1  0    Adult mood disorder questionnaire endorses 6 out of 13 items not proximate in time not considered problems symptoms of self confidence, missing lost sleep, pressured speech and hyper behavior, and racing thoughts easily distracted which clinically are attributed to high anxiety and restless legs rather than ADHD or hypomania.  Receiving Psychotherapy: No   Treatment Plan/Recommendations: Patient notes hesitance by primary care with Klonopin in brief time-limited treatment likely predominantly concern for tolerance and loss of efficacy when medication may undermine of sports and academic sharpness.  However patient sees no adverse effects though still seeking additional therapeutics to stabilize his anxiety and panic for ability to accomplish his goals.  He continues Lexapro 20 mg every morning having current supply.  Klonopin is E scribed 0.5 mg up to 3 times daily as needed for anxiety or panic whether as needed or scheduled though currently as needed for generalized anxiety and panic sent to [facility].  Neurontin is E scribed 100 mg capsule to take 2 capsules at bedtime for 4 to 6 days then titrate up to 2 capsules every morning and bedtime sent as #120 capsules and no refills to [facility] for generalized anxiety and panic.  Psychosupportive psychoeducation clarifies for patient in  parallel to helping younger brother understand his similar problem so that medication can be of optimal benefit for personal adaptation and change with cognitive behavioral nutrition, sleep hygiene, social skills, and  frustration management.  He returns for follow-up in 11 days as he returns to college 05/18/2019.    Chauncey MannGlenn E Jennings, MD

## 2019-05-04 NOTE — Patient Instructions (Signed)
Take gabapentin 100 mg (Neurontin) as 2 capsules total 200 mg every bedtime for 4 days then add a morning dose with 1 capsule increased after 2 days to 2 capsules for total nightly of 200 mg twice daily.  Change the clonazepam 0.5 mg (Klonopin) to 1 tablet up to 3 times daily as needed for age or performance or test anxiety.  Continue Lexapro 20 mg every morning having current supply.

## 2019-05-09 ENCOUNTER — Emergency Department (HOSPITAL_BASED_OUTPATIENT_CLINIC_OR_DEPARTMENT_OTHER): Payer: Commercial Managed Care - PPO

## 2019-05-09 ENCOUNTER — Other Ambulatory Visit: Payer: Self-pay

## 2019-05-09 ENCOUNTER — Emergency Department (HOSPITAL_BASED_OUTPATIENT_CLINIC_OR_DEPARTMENT_OTHER)
Admission: EM | Admit: 2019-05-09 | Discharge: 2019-05-09 | Disposition: A | Discharge status: Home / Self Care | Payer: Commercial Managed Care - PPO | Attending: Emergency Medicine | Admitting: Emergency Medicine

## 2019-05-09 ENCOUNTER — Other Ambulatory Visit: Payer: Self-pay | Admitting: Pediatrics

## 2019-05-09 ENCOUNTER — Encounter (HOSPITAL_BASED_OUTPATIENT_CLINIC_OR_DEPARTMENT_OTHER): Payer: Self-pay | Admitting: Adult Health

## 2019-05-09 DIAGNOSIS — R079 Chest pain, unspecified: Secondary | ICD-10-CM | POA: Diagnosis present

## 2019-05-09 DIAGNOSIS — R072 Precordial pain: Secondary | ICD-10-CM

## 2019-05-09 DIAGNOSIS — R0602 Shortness of breath: Secondary | ICD-10-CM | POA: Insufficient documentation

## 2019-05-09 HISTORY — DX: COVID-19: U07.1

## 2019-05-09 LAB — BASIC METABOLIC PANEL
Anion gap: 11 (ref 5–15)
BUN: 10 mg/dL (ref 6–20)
CO2: 24 mmol/L (ref 22–32)
Calcium: 9.6 mg/dL (ref 8.9–10.3)
Chloride: 103 mmol/L (ref 98–111)
Creatinine, Ser: 1.23 mg/dL (ref 0.61–1.24)
GFR calc Af Amer: >60 mL/min (ref >60)
GFR calc non Af Amer: >60 mL/min (ref >60)
Glucose, Bld: 90 mg/dL (ref 70–99)
Potassium: 3.9 mmol/L (ref 3.5–5.1)
Sodium: 138 mmol/L (ref 135–145)

## 2019-05-09 LAB — CBC
HCT: 49.2 % (ref 39.0–52.0)
Hemoglobin: 16.1 g/dL (ref 13.0–17.0)
MCH: 28.9 pg (ref 26.0–34.0)
MCHC: 32.7 g/dL (ref 30.0–36.0)
MCV: 88.3 fL (ref 80.0–100.0)
Platelets: 297 10*3/uL (ref 150–400)
RBC: 5.57 MIL/uL (ref 4.22–5.81)
RDW: 13.2 % (ref 11.5–15.5)
WBC: 5.4 10*3/uL (ref 4.0–10.5)
nRBC: 0 % (ref 0.0–0.2)

## 2019-05-09 LAB — TROPONIN I (HIGH SENSITIVITY): Troponin I (High Sensitivity): 4 ng/L (ref <18)

## 2019-05-09 LAB — D-DIMER, QUANTITATIVE: D-Dimer, Quant: <0.27 ug/mL-FEU (ref 0.00–0.50)

## 2019-05-09 MED ORDER — SODIUM CHLORIDE 0.9% FLUSH
3.0000 mL | Freq: Once | INTRAVENOUS | Status: DC
  Filled 2019-05-09: qty 3

## 2019-05-09 NOTE — ED Triage Notes (Signed)
Presents with SOB, CP and fatigue. He reports that the CP began while working out yesterday and since has been coming and going, Painis located on the left side and radiates down the left arm. He endorses SOB with the pain. The pain is intermittent. Activity makes the pain worse. He recently got over Covid this last month and had a negative result. This is the first week he has worked out since he was sick.

## 2019-05-09 NOTE — ED Provider Notes (Signed)
Emergency Department Provider Note   I have reviewed the triage vital signs and the nursing notes.   HISTORY  Chief Complaint Chest Pain   HPI Lucas Sherman is a 18 y.o. male presents to the emergency department for evaluation of fatigue, chest discomfort, shortness of breath.  He reports COVID positive test with symptoms approximately 1 month ago but states he has completely recovered after this.  He has been increasing his workouts recently and reports using a "pre-workout" supplement which includes approximately 350 mg of caffeine along with other supplementation prior to working out.  Patient states that he has been lifting weights and performing significant aerobic activity.  He notes that movement and activity makes the pain and shortness of breath symptoms worse.  He is not experiencing fevers or shaking chills. Does note some discomfort at this time.    Past Medical History:  Diagnosis Date  . Anxiety   . COVID-19     Patient Active Problem List   Diagnosis Date Noted  . Panic disorder 05/04/2019  . Restless legs syndrome 05/04/2019  . Sore throat 11/15/2017  . Viral syndrome 11/15/2017  . Encounter for routine child health examination without abnormal findings 10/26/2016  . BMI (body mass index), pediatric, > 99% for age 50/24/2017  . BMI (body mass index), pediatric, 95-99% for age 37/11/2013  . Well child check 08/04/2013  . Generalized anxiety disorder 02/03/2013  . Acne vulgaris 07/22/2012  . 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 ELBOW SURGERY      Allergies Patient has no known allergies.  Family History  Problem Relation Age of Onset  . Depression Mother   . Anxiety disorder Mother   . Depression Maternal Grandmother   . Anxiety disorder Brother   . Panic disorder Brother   . Depression Other   . Alcohol abuse Other   . Arthritis Neg Hx   . Asthma Neg Hx   . Birth defects  Neg Hx   . Cancer Neg Hx   . COPD Neg Hx   . Diabetes Neg Hx   . Drug abuse Neg Hx   . Early death Neg Hx   . Hearing loss Neg Hx   . Heart disease Neg Hx   . Hyperlipidemia Neg Hx   . Learning disabilities Neg Hx   . Kidney disease Neg Hx   . Hypertension Neg Hx   . Mental illness Neg Hx   . Miscarriages / Stillbirths Neg Hx   . Mental retardation Neg Hx   . Stroke Neg Hx   . Vision loss Neg Hx   . Varicose Veins Neg Hx     Social History Social History   Tobacco Use  . Smoking status: Never Smoker  . Smokeless tobacco: Never Used  Substance Use Topics  . Alcohol use: Yes    Comment: occasional  . Drug use: Never    Review of Systems  Constitutional: No fever/chills Eyes: No visual changes. ENT: No sore throat. Cardiovascular: Positive chest pain. Respiratory: Positive shortness of breath. Gastrointestinal: No abdominal pain.  No nausea, no vomiting.  No diarrhea.  No constipation. Genitourinary: Negative for dysuria. Musculoskeletal: Negative for back pain. Skin: Negative for rash. Neurological: Negative for headaches, focal weakness or numbness.  10-point ROS otherwise negative.  ____________________________________________   PHYSICAL EXAM:  VITAL SIGNS: ED Triage Vitals  Enc Vitals Group     BP 05/09/19 1309 (!) 131/94  Pulse Rate 05/09/19 1309 68     Resp 05/09/19 1309 18     Temp 05/09/19 1309 98.1 F (36.7 C)     Temp Source 05/09/19 1309 Oral     SpO2 05/09/19 1309 98 %     Weight 05/09/19 1310 217 lb (98.4 kg)     Height 05/09/19 1310 5\' 9"  (1.753 m)   Constitutional: Alert and oriented. Well appearing and in no acute distress. Eyes: Conjunctivae are normal.  Head: Atraumatic. Nose: No congestion/rhinnorhea. Mouth/Throat: Mucous membranes are moist.  Neck: No stridor.  Cardiovascular: Normal rate, regular rhythm. Good peripheral circulation. Grossly normal heart sounds.   Respiratory: Normal respiratory effort.  No retractions. Lungs  CTAB. Gastrointestinal: Soft and nontender. No distention.  Musculoskeletal: No lower extremity tenderness nor edema. No gross deformities of extremities. Neurologic:  Normal speech and language. No gross focal neurologic deficits are appreciated.  Skin:  Skin is warm, dry and intact. No rash noted.  ____________________________________________   LABS (all labs ordered are listed, but only abnormal results are displayed)  Labs Reviewed  BASIC METABOLIC PANEL  CBC  D-DIMER, QUANTITATIVE (NOT AT [facility])  TROPONIN I (HIGH SENSITIVITY)   ____________________________________________  EKG   EKG Interpretation  Date/Time:  Saturday May 09 2019 13:18:50 EDT Ventricular Rate:  75 PR Interval:    QRS Duration: 87 QT Interval:  350 QTC Calculation: 391 R Axis:   78 Text Interpretation:  Sinus rhythm Borderline T abnormalities, inferior leads No STEMI  Confirmed by Alona BeneLong, Joshua [phone]([id number]) on 05/09/2019 1:21:59 PM Also confirmed by Alona BeneLong, Joshua [phone]), editor Barbette Hairassel, Kerry [phone])  on 05/09/2019 1:43:02 PM       ____________________________________________  RADIOLOGY  Dg Chest 2 View  Result Date: 05/09/2019 CLINICAL DATA:  Chest pain. EXAM: CHEST - 2 VIEW COMPARISON:  None. FINDINGS: The heart size and mediastinal contours are within normal limits. Both lungs are clear. The visualized skeletal structures are unremarkable. IMPRESSION: No active cardiopulmonary disease. Electronically Signed   By: Gerome Samavid  Williams III M.D   On: 05/09/2019 13:55    ____________________________________________   PROCEDURES  Procedure(s) performed:   Procedures  None ____________________________________________   INITIAL IMPRESSION / ASSESSMENT AND PLAN / ED COURSE  Pertinent labs & imaging results that were available during my care of the patient were reviewed by me and considered in my medical decision making (see chart for details).   Patient presents to the emergency department for evaluation  of chest pain, shortness of breath, fatigue.  He was diagnosed with COVID-19 1 month ago but has made a complete recovery.  My suspicion for PE or other COVID related complication is very low.  Labs obtained, in the setting, which showed normal troponin and negative d-dimer.  Chest x-ray is negative.  Suspect that patient's pain is due to overexertion/fatigue.  Also discussed the significant caffeine use prior to working out and how this could be dangerous combination.  Patient states he will no longer use the "pre-workout" formulation and plans to take several days off and return at a more modest pace.  I discussed that if he continues to have symptoms, despite doing this, he will need to follow with his PCP for further evaluation.  Also discussed ED return precautions in detail with mom at bedside.  Patient verbalizes understanding and is pleased at discharge.   ____________________________________________  FINAL CLINICAL IMPRESSION(S) / ED DIAGNOSES  Final diagnoses:  Precordial chest pain  SOB (shortness of breath)    Note:  This document was  prepared using Conservation officer, historic buildingsDragon voice recognition software and may include unintentional dictation errors.  Alona BeneJoshua Long, MD Emergency Medicine    Long, Arlyss RepressJoshua G, MD 05/09/19 2006

## 2019-05-09 NOTE — Discharge Instructions (Signed)
You were seen in the emergency department today with chest pain and trouble breathing.  Your lab work was normal and your chest x-ray was normal.  Please limit the excessive caffeine in your pre-workout routine and take several days off from heavy lifting and/or workouts.  Drink plenty of fluids.  You may take Tylenol and/or naproxen as needed for discomfort in the chest.  There are also several over-the-counter, topical pain/muscle relief type medications which you could try.  Please call your PCP on Monday to schedule a follow-up appointment if your symptoms continue but do not worsen.  Return to the emergency department if you experience any new or suddenly worsening chest pain, shortness of breath, lightheadedness.

## 2019-05-11 ENCOUNTER — Telehealth: Payer: Self-pay | Admitting: Psychiatry

## 2019-05-11 NOTE — Telephone Encounter (Signed)
Pt was seen by GJ on 8/3 he is requesting to be seen by a Therapist here and would like you to do the referral.

## 2019-05-11 NOTE — Telephone Encounter (Signed)
The anxiety diagnoses from first appointment here can best be addressed psychotherapeutically by the cognitive behavioral skills of Shanon Ace, LCSW if someone can help set that up for Lucas Sherman.

## 2019-05-14 ENCOUNTER — Ambulatory Visit: Payer: Commercial Managed Care - PPO | Admitting: Psychiatry

## 2019-05-15 ENCOUNTER — Encounter (HOSPITAL_BASED_OUTPATIENT_CLINIC_OR_DEPARTMENT_OTHER): Payer: Self-pay

## 2019-05-15 ENCOUNTER — Emergency Department (HOSPITAL_BASED_OUTPATIENT_CLINIC_OR_DEPARTMENT_OTHER)
Admission: EM | Admit: 2019-05-15 | Discharge: 2019-05-15 | Disposition: A | Discharge status: Home / Self Care | Payer: Commercial Managed Care - PPO | Attending: Emergency Medicine | Admitting: Emergency Medicine

## 2019-05-15 ENCOUNTER — Other Ambulatory Visit: Payer: Self-pay

## 2019-05-15 DIAGNOSIS — R0789 Other chest pain: Secondary | ICD-10-CM | POA: Diagnosis present

## 2019-05-15 DIAGNOSIS — R072 Precordial pain: Secondary | ICD-10-CM | POA: Insufficient documentation

## 2019-05-15 NOTE — ED Notes (Signed)
Pt placed on cardiac monitor 

## 2019-05-15 NOTE — ED Provider Notes (Signed)
[facility] Provider Note   CSN: [id number] Arrival date & time: 05/15/19  1338    History   Chief Complaint Chief Complaint  Patient presents with  . Chest Pain    HPI [name] is a 18 y.o. male.     The history is provided by the patient and medical records. No language interpreter was used.  Chest Pain Associated symptoms: no cough, no palpitations and no shortness of breath    Lucas Sherman is a 17 y.o. male  with a PMH of anxiety, restless leg syndrome, previous COVID diagnosis about a month ago who presents to the Emergency Department complaining of persistent chest discomfort.  Central to left-sided.  Feels sharp.  This has been an ongoing issue for a couple of weeks now.  He was seen in the emergency department on 8/08 for same with reassuring work-up.  At that time, he was taking a pre-workout supplement which has a significant amount of caffeine along with other caffeinated beverages and supplements.  He does report stopping all of these since last week.  He reports no caffeinated beverages, alcohol or stimulants of any kind.  His pain is constant but does seem to get worse when he goes on his 1 mile runs. Improves quickly.  Denies family history of sudden cardiac events.  No history of hypertension, hyperlipidemia, diabetes, heart disease, prior DVT/PE/coagulopathy.  Past Medical History:  Diagnosis Date  . Anxiety   . COVID-19     Patient Active Problem List   Diagnosis Date Noted  . Panic disorder 05/04/2019  . Restless legs syndrome 05/04/2019  . Sore throat 11/15/2017  . Viral syndrome 11/15/2017  . Encounter for routine child health examination without abnormal findings 10/26/2016  . BMI (body mass index), pediatric, > 99% for age 65/24/2017  . BMI (body mass index), pediatric, 95-99% for age 61/11/2013  . Well child check 08/04/2013  . Generalized anxiety disorder 02/03/2013  . Acne vulgaris 07/22/2012  . 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 ELBOW SURGERY          Home Medications    Prior to Admission medications   Medication Sig Start Date End Date Taking? Authorizing Provider  Adapalene-Benzoyl Peroxide (EPIDUO) 0.1-2.5 % gel Apply 1 application topically at bedtime. 07/07/15   Marcha Solders, MD  clobetasol ointment (TEMOVATE) 0.05 %  05/22/12   [provider]  clonazePAM (KLONOPIN) 0.5 MG tablet Take 0.5 mg by mouth 2 (two) times daily as needed. 04/13/19   [provider]  clonazePAM (KLONOPIN) 0.5 MG tablet Take 1 tablet (0.5 mg total) by mouth 3 (three) times daily as needed for anxiety. 05/04/19   Delight Hoh, MD  escitalopram (LEXAPRO) 20 MG tablet TAKE 1 TABLET DAILY 05/14/18   Marcha Solders, MD  fluticasone [facility]) 50 MCG/ACT nasal spray Place 1 spray into both nostrils daily. 07/07/15 07/06/16  Marcha Solders, MD  gabapentin (NEURONTIN) 100 MG capsule Take 2 capsules (200 mg total) by mouth 2 (two) times daily. 05/04/19   Delight Hoh, MD  glycopyrrolate (ROBINUL) 1 MG tablet  05/20/12   [provider]  ondansetron (ZOFRAN ODT) 4 MG disintegrating tablet Take 1 tablet (4 mg total) by mouth every 8 (eight) hours as needed. 04/06/19   Horton, Barbette Hair, MD  TACLONEX external suspension  05/21/12   [provider]    Family History Family History  Problem  Relation Age of Onset  . Depression Mother   . Anxiety disorder Mother   . Depression Maternal Grandmother   . Anxiety disorder Brother   . Panic disorder Brother   . Depression Other   . Alcohol abuse Other   . Arthritis Neg Hx   . Asthma Neg Hx   . Birth defects Neg Hx   . Cancer Neg Hx   . COPD Neg Hx   . Diabetes Neg Hx   . Drug abuse Neg Hx   . Early death Neg Hx   . Hearing loss Neg Hx   . Heart disease Neg Hx   . Hyperlipidemia Neg Hx   . Learning disabilities Neg Hx   . Kidney disease Neg Hx   .  Hypertension Neg Hx   . Mental illness Neg Hx   . Miscarriages / Stillbirths Neg Hx   . Mental retardation Neg Hx   . Stroke Neg Hx   . Vision loss Neg Hx   . Varicose Veins Neg Hx     Social History Social History   Tobacco Use  . Smoking status: Never Smoker  . Smokeless tobacco: Never Used  Substance Use Topics  . Alcohol use: Yes    Comment: occasional  . Drug use: Not Currently    Types: Marijuana     Allergies   Reglan [metoclopramide]   Review of Systems Review of Systems  Respiratory: Negative for cough and shortness of breath.   Cardiovascular: Positive for chest pain. Negative for palpitations and leg swelling.  All other systems reviewed and are negative.    Physical Exam Updated Vital Signs BP 128/67 (BP Location: Left Arm)   Pulse 81   Temp 98.6 F (37 C) (Oral)   Resp 16   Ht 5\' 9"  (1.753 m)   Wt 99.8 kg   SpO2 100%   BMI 32.49 kg/m   Physical Exam Vitals signs and nursing note reviewed.  Constitutional:      General: He is not in acute distress.    Appearance: He is well-developed.  HENT:     Head: Normocephalic and atraumatic.  Neck:     Musculoskeletal: Neck supple.  Cardiovascular:     Rate and Rhythm: Normal rate and regular rhythm.     Heart sounds: Normal heart sounds. No murmur.     Comments: Regular rate and rhythm without murmur. Pulmonary:     Effort: Pulmonary effort is normal. No respiratory distress.     Breath sounds: Normal breath sounds.     Comments: Lungs clear to auscultation bilaterally. Abdominal:     General: There is no distension.     Palpations: Abdomen is soft.     Tenderness: There is no abdominal tenderness.  Musculoskeletal:     Comments: No lower extremity edema or calf tenderness.  Skin:    General: Skin is warm and dry.  Neurological:     Mental Status: He is alert and oriented to person, place, and time.      ED Treatments / Results  Labs (all labs ordered are listed, but only abnormal  results are displayed) Labs Reviewed - No data to display  EKG EKG Interpretation  Date/Time:  Friday May 15 2019 13:51:50 EDT Ventricular Rate:  73 PR Interval:  150 QRS Duration: 90 QT Interval:  342 QTC Calculation: 376 R Axis:   88 Text Interpretation:  Normal sinus rhythm Nonspecific ST and T wave abnormality Abnormal ECG No STEMI  Confirmed by Alona BeneLong, Joshua (  [id number]) on 05/15/2019 2:37:41 PM   Radiology No results found.  Procedures Procedures (including critical care time)  Medications Ordered in ED Medications - No data to display   Initial Impression / Assessment and Plan / ED Course  I have reviewed the triage vital signs and the nursing notes.  Pertinent labs & imaging results that were available during my care of the patient were reviewed by me and considered in my medical decision making (see chart for details).       [name] D Isidro is a 18 y.o. male who presents to ED for constant central to left-sided chest pain which is been an ongoing issue for him for about 2 to 3 weeks now.  On exam, he is afebrile, hemodynamically stable with normal cardiopulmonary examination.  Chart reviewed.  He was seen in the emergency department on 8/08 for similar complaint.  At that time, he had been taking large amount of caffeine and pre-workout supplements.  He has discontinued all of this thankfully.  He still continuing to have symptoms.  During that visit, he did have a very reassuring cardiac work-up including lab work, EKG, chest x-ray.  Troponin and d-dimer were both within normal limits.  His EKG today is nonischemic shows no signs of wpw, Brugada.  He was diagnosed with COVID about a month ago but reports full recovery from this.  Given his persistent chest pain which he does not feel gets worse with workouts/exertion, I will refer him to cardiology for ongoing evaluation.  I do not feel that repeating blood work Beverely Low/enzymes will be of much benefit.  Same for chest x-ray.   Discussed this with patient who is in agreement and tells me that he just came to the hospital today because he wants to see a cardiologist. Evaluation does not show pathology that would require ongoing emergent intervention or inpatient treatment.  Discussed reasons to return to the emergency department at length and all questions were answered.  Patient discussed with Dr. Jacqulyn BathLong who agrees with treatment plan.    Final Clinical Impressions(s) / ED Diagnoses   Final diagnoses:  Precordial chest pain    ED Discharge Orders    None       Ishitha Roper, Chase PicketJaime Pilcher, PA-C 05/15/19 1447    Maia PlanLong, Joshua G, MD 05/15/19 2007

## 2019-05-15 NOTE — Discharge Instructions (Signed)
It was my pleasure taking care of you today!  Call the cardiology clinic listed to schedule a follow up appointment.   Return to ER for new or worsening symptoms, any additional concerns.

## 2019-05-15 NOTE — ED Triage Notes (Addendum)
Pt c/o CP x 1 week-NAD-steady gait-pt tested +covid end of May/beginning of June-pt has cont'd to have diarrhea-denies fever,cough/flu like sx

## 2019-05-18 ENCOUNTER — Ambulatory Visit (INDEPENDENT_AMBULATORY_CARE_PROVIDER_SITE_OTHER): Payer: Commercial Managed Care - PPO | Admitting: Psychiatry

## 2019-05-18 ENCOUNTER — Other Ambulatory Visit: Payer: Self-pay

## 2019-05-18 DIAGNOSIS — F411 Generalized anxiety disorder: Secondary | ICD-10-CM | POA: Diagnosis not present

## 2019-05-18 NOTE — Progress Notes (Addendum)
[facility] Counselor Initial Adult Exam  Name: Lucas Sherman Date: 05/18/2019 MRN: [id number] DOB: 05-Aug-2001 PCP: [facility]  Time spent:  65 minutes     11:00am to 12:05pm  Guardian/Payee:  patient    Paperwork requested:  No   Reason for Visit /Presenting Problem:  Anxiety, depression when overcome with anxiety. (mom and 20 yr old brother both have anxiety)  Mental Status Exam:   Appearance:   Casual     Behavior:  Appropriate and Sharing  Motor:  Normal  Speech/Language:   Negative  Affect:  anxious  Mood:  anxious  Thought process:  normal  Thought content:    WNL  Sensory/Perceptual disturbances:    WNL  Orientation:  oriented to person, place, time/date, situation, day of week, month of year and year  Attention:  patient says he does get distracted sometimes but it's inconsistent  Concentration:  Fair and per patient who reports some distractiveness  Memory:  Lasana of knowledge:   Good  Insight:    Good  Judgment:   Good  Impulse Control:  Good   Reported Symptoms:  Anxiety, some depression  Risk Assessment: Danger to Self:  No Self-injurious Behavior: No Danger to Others: No Duty to Warn:no Physical Aggression / Violence:No  Access to Firearms a concern: No  Gang Involvement:No  Patient / guardian was educated about steps to take if suicide or homicide risk level increases between visits:  PATIENT DENIES ANY SI OR HI.  While future psychiatric events cannot be accurately predicted, the patient does not currently require acute inpatient psychiatric care and does not currently meet [facility] involuntary commitment criteria.  Substance Abuse History: Current substance abuse: No     Past Psychiatric History:   Previous psychological history is significant for anxiety Outpatient Providers:does not remember names History of Psych Hospitalization: No  Psychological Testing: n/a   Abuse History: Victim of No., none    Report needed: No. Victim of Neglect:No. Perpetrator of none  Witness / Exposure to Domestic Violence: No   Protective Services Involvement: No  Witness to [employer] Violence:  No   Family History: Reviewed with patient and as much info as he has he feels this is accurate info below. Family History  Problem Relation Age of Onset  . Depression Mother   . Anxiety disorder Mother   . Depression Maternal Grandmother   . Anxiety disorder Brother   . Panic disorder Brother   . Depression Other   . Alcohol abuse Other   . Arthritis Neg Hx   . Asthma Neg Hx   . Birth defects Neg Hx   . Cancer Neg Hx   . COPD Neg Hx   . Diabetes Neg Hx   . Drug abuse Neg Hx   . Early death Neg Hx   . Hearing loss Neg Hx   . Heart disease Neg Hx   . Hyperlipidemia Neg Hx   . Learning disabilities Neg Hx   . Kidney disease Neg Hx   . Hypertension Neg Hx   . Mental illness Neg Hx   . Miscarriages / Stillbirths Neg Hx   . Mental retardation Neg Hx   . Stroke Neg Hx   . Vision loss Neg Hx   . 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  I'm anxious about anxiety.  Fear of the unknown  Strengths:  Supportive Relationships, Family,  Friends and Church  Barriers:  my anxiety  Legal History: Pending legal issue / charges: The patient has no significant history of legal issues. History of legal issue / charges: none reported  Medical History/Surgical History:Reviewed with patient and he confirms information below except states the pharmacy gave him the Flonase but no doctor had prescribed it for him.  Not sure why he got it but did not use it.   Past Medical History:  Diagnosis Date  . Anxiety   . COVID-19     Past Surgical History:  Procedure Laterality Date  . ELBOW SURGERY      Medications: Current Outpatient Medications  Medication Sig Dispense Refill  . Adapalene-Benzoyl Peroxide (EPIDUO) 0.1-2.5 % gel Apply 1 application topically at bedtime. 45 g 12  . clobetasol ointment (TEMOVATE) 0.05 %     . clonazePAM (KLONOPIN) 0.5 MG tablet Take 0.5 mg by mouth 2 (two) times daily as needed.    . clonazePAM (KLONOPIN) 0.5 MG tablet Take 1 tablet (0.5 mg total) by mouth 3 (three) times daily as needed for anxiety. 90 tablet 0  . escitalopram (LEXAPRO) 20 MG tablet TAKE 1 TABLET DAILY 90 tablet 3  . fluticasone (FLONASE) 50 MCG/ACT nasal spray Place 1 spray into both nostrils daily. 16 g 2  . gabapentin (NEURONTIN) 100 MG capsule Take 2 capsules (200 mg total) by mouth 2 (two) times daily. 120 capsule 0  . glycopyrrolate (ROBINUL) 1 MG tablet     . ondansetron (ZOFRAN ODT) 4 MG disintegrating tablet Take 1 tablet (4 mg total) by mouth every 8 (eight) hours as needed. 20 tablet 0  . TACLONEX external suspension      No current facility-administered medications for this visit.     Allergies  Allergen Reactions  . Reglan [Metoclopramide]     "anxiety"    Diagnoses:    ICD-10-CM   1. Generalized anxiety disorder  F41.1    Subjective:  Patient is a 18 yr old, caucasian, young male, current college sophomore student at [employer], plays baseball at college, previously played at [employer].   Both parents in the home, Dad is an Art gallery managerengineer, Mom works at [employer],feels equally close to both.  Has a 3769yr old brother and 515 yr old sister and is somewhat closer to brother.  Is looking forward to returning to college and likes the academics and baseball.  Hoping to eventually work in business or business finance. Before going back to college, he has to see cardiologist on Aug. 26th, not totally sure about what's going on but have had some high heart rate and his BP---to have some testing done and doctors think his symptoms may be tied to his previous bout with COVID-19 at end of May  2020.  Has had 2 negative tests since then.   Today, presents as pleasant and is less anxious the more we talk today.  He reports that his anxiety is sometimes lasting all day and at other times it fluctuates.  Performance anxiety with baseball. Also has heightened anxiety at other times and has had some panic attacks before, none within past week.  Sometimes worse when I haven't slept well.    Has begun to use some meditation and deep breathing--got the info off the internet. We reviewed specific techniques for deep breathing exercises, meditation, and focusing on positives.  **Established goals. Goal plan agreed upon but not signed on computer screen due to coronavirus.   Plan of Care:  Today we spoke about patient's symptoms, past efforts to manage anxiety, and current strategies to help him better manage his anxiety, and maintain a positive attitude.  Is waiting for results from cardiologist in [place] next week, and is holding off returning to school until he knows what he needs to do for his physical health.  Lot of anxiety right now is "waiting and seeing" what's next.  On 1-10 scale , he rates himself as a "3" at end of session today.  "Sometimes it can be much higher like a 8." Will see again in 1-2 weeks.    Mathis Fareeborah Shellie Goettl, LCSW

## 2019-05-19 ENCOUNTER — Ambulatory Visit (INDEPENDENT_AMBULATORY_CARE_PROVIDER_SITE_OTHER): Payer: Commercial Managed Care - PPO | Admitting: Psychiatry

## 2019-05-19 ENCOUNTER — Encounter: Payer: Self-pay | Admitting: Psychiatry

## 2019-05-19 ENCOUNTER — Other Ambulatory Visit: Payer: Self-pay

## 2019-05-19 VITALS — Ht 69.0 in | Wt 218.0 lb

## 2019-05-19 DIAGNOSIS — F411 Generalized anxiety disorder: Secondary | ICD-10-CM

## 2019-05-19 DIAGNOSIS — G2581 Restless legs syndrome: Secondary | ICD-10-CM

## 2019-05-19 DIAGNOSIS — F410 Panic disorder [episodic paroxysmal anxiety] without agoraphobia: Secondary | ICD-10-CM | POA: Diagnosis not present

## 2019-05-19 NOTE — Progress Notes (Signed)
[facility] Med Check  Patient ID: Lucas Sherman,  MRN: [id number]  PCP: [facility]  Date of Evaluation: 05/19/2019 Time spent:20 minutes  From 1005 to 1025 Chief Complaint:  Chief Complaint    Anxiety; Panic Attack      HISTORY/CURRENT STATUS: Lucas Sherman is seen onsite in office face-to-face with consent individually with epic collateral for psychiatric interview and exam in 2-week evaluation and management of generalized anxiety, panic disorder, and restless legs syndrome.  He has implemented half of the therapeutic plans from last appointment being most comfortable himself starting therapy with Shanon Ace, LCSW.  He requests therapy multiple times weekly currently scheduled as weekly.  He discusses how supportive friends from other colleges, family friend Dr. Marcelino Scot, and parents have been relative to his gradual acceptance that he is healthy having obesity, acne, and psoriasis though now scheduled to see cardiology 05/27/2023 for his left chest radiating to left shoulder pain for which he has been to the emergency department twice August 10 and 14 with 2 EKGs, chest x-ray, and screening BMP and CBC all normal.  Currently the ED offered Pristiq and mother attempts to reassure him that the chest pain is panic and not his heart.  Patient notes that his first chest pain started after an energy drink with large amount of caffeine consumed prior to starting workout in the gym which was vigorous so that he considers exertion to be the cause of his chest pain.  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  He is still not sleeping well and did not take the gabapentin at bedtime but only as 200 mg in the morning which he tolerates well having no drowsiness including on his Klonopin 0.5 mg taking  as many as 3 times daily end of last week but none yesterday.  He reports that he is much relieved as far as anxiety the last 24 to 48 hours and agrees with the course of the session to titrate up to the full dose of the gabapentin plan from last appointment and to use to Klonopin when needed to stop the panic.  He has no mania, psychosis, delirium, or suicidality.  Anxiety Presents for initial visit. Onset was more than 5 years ago. The problem has been waxing and waning. Symptoms include chest pain, excessive worry, hyperventilation, insomnia, muscle tension, nervous/anxious behavior, palpitations and panic. Patient reports no compulsions, decreased concentration, depressed mood, nausea or suicidal ideas. Symptoms occur most days. The severity of symptoms is interfering with daily activities, causing significant distress and moderate. The symptoms are aggravated by family issues, caffeine, medication, social activities and work stress. The quality of sleep is poor. Nighttime awakenings: several.   Risk factors include change in medication, family history and a major life event. His past medical history is significant for anxiety/panic attacks. There is no history of anemia, arrhythmia, bipolar disorder, CAD, depression or suicide attempts. Past treatments include benzodiazephines, counseling (CBT), non-benzodiazephine anxiolytics and SSRIs. The treatment provided moderate relief. Compliance with prior treatments has been variable. Prior compliance problems include medical issues and medication issues.    Individual Medical History/ Review of Systems: Changes? :Yes Above ED notes and assessments are reviewed and integrated.  Allergies: Reglan [metoclopramide]  Current Medications:  Current Outpatient Medications:  .  Adapalene-Benzoyl Peroxide (EPIDUO)  0.1-2.5 % gel, Apply 1 application topically at bedtime., Disp: 45 g, Rfl: 12 .  clobetasol ointment (TEMOVATE) 0.05 %, , Disp: , Rfl:  .  clonazePAM  (KLONOPIN) 0.5 MG tablet, Take 1 tablet (0.5 mg total) by mouth 3 (three) times daily as needed for anxiety., Disp: 90 tablet, Rfl: 0 .  escitalopram (LEXAPRO) 20 MG tablet, TAKE 1 TABLET DAILY, Disp: 90 tablet, Rfl: 3 .  fluticasone (FLONASE) 50 MCG/ACT nasal spray, Place 1 spray into both nostrils daily., Disp: 16 g, Rfl: 2 .  gabapentin (NEURONTIN) 100 MG capsule, Take 2 capsules (200 mg total) by mouth 2 (two) times daily., Disp: 120 capsule, Rfl: 0 .  glycopyrrolate (ROBINUL) 1 MG tablet, , Disp: , Rfl:  .  ondansetron (ZOFRAN ODT) 4 MG disintegrating tablet, Take 1 tablet (4 mg total) by mouth every 8 (eight) hours as needed., Disp: 20 tablet, Rfl: 0 .  TACLONEX external suspension, , Disp: , Rfl:    Medication Side Effects: none  Family Medical/ Social History: Changes? No  MENTAL HEALTH EXAM:  Height 5\' 9"  (1.753 m), weight 218 lb (98.9 kg).Body mass index is 32.19 kg/m.  Others deferred for coronavirus pandemic being in the ED twice in the last week  General Appearance: Casual, Fairly Groomed, Guarded, Meticulous and Obese  Eye Contact:  Fair  Speech:  Clear and Coherent, Normal Rate and Talkative  Volume:  Normal  Mood:  Anxious, Euthymic and Worthless  Affect:  Inappropriate, Labile and Anxious  Thought Process:  Coherent, Irrelevant and Linear  Orientation:  Full (Time, Place, and Person)  Thought Content: Ilusions, Obsessions and Rumination   Suicidal Thoughts:  No  Homicidal Thoughts:  No  Memory:  Immediate;   Good Remote;   Good  Judgement:  Fair  Insight:  Fair  Psychomotor Activity:  Normal, Increased and Restlessness and mannerisms  Concentration:  Concentration: Fair and Attention Span: Good  Recall:  Good  Fund of Knowledge: Good  Language: Good  Assets:  Desire for Improvement Intimacy Talents/Skills  ADL's:  Intact  Cognition: WNL  Prognosis:  Good    DIAGNOSES:    ICD-10-CM   1. Generalized anxiety disorder  F41.1   2. Panic disorder  F41.0   3.  Restless legs syndrome  G25.81     Receiving Psychotherapy: Yes Mathis Fareeborah Dowd, LCSW   RECOMMENDATIONS: Patient states he has an adequate supply of medications as Dr. Barney Drainamgoolam has prescribed him a year of the Lexapro 20 mg every morning for panic and generalized anxiety.  He has Klonopin not yet filled but likely waiting ready at the pharmacy for 0.5 mg up to 3 times daily as needed for panic or generalized anxiety at [facility].  He has current supply of gabapentin 100 mg taking 2 every morning having refused to take the 2 at bedtime until this appointment when he agrees to start as well having the original supply from 05/04/2019 of number 120 capsules with no refill to call for refill with updated directions when needed from [facility] for RLS and panic disorder.  He can consider augmenting Lexapro or increasing the dose to 30 mg by next appointment if cardiology consultation normal and patient more confident in the diagnosis of panic, as all will help him with this.  Psychosupportive psychoeducation integrated with cognitive behavioral sleep hygiene, nutrition, social problem solving and frustration management interventions prepare him for management of upcoming responsibilities and interferences/obstacles.  Returns for follow-up in 3 to 4 weeks.  Velda ShellGlenn E  Creig Hines, MD

## 2019-05-26 ENCOUNTER — Ambulatory Visit: Payer: Commercial Managed Care - PPO | Admitting: Psychiatry

## 2019-05-27 ENCOUNTER — Ambulatory Visit: Payer: Commercial Managed Care - PPO | Admitting: Cardiology

## 2019-05-28 ENCOUNTER — Ambulatory Visit (INDEPENDENT_AMBULATORY_CARE_PROVIDER_SITE_OTHER): Payer: Commercial Managed Care - PPO | Admitting: Cardiology

## 2019-05-28 ENCOUNTER — Encounter: Payer: Self-pay | Admitting: Cardiology

## 2019-05-28 ENCOUNTER — Other Ambulatory Visit: Payer: Self-pay

## 2019-05-28 VITALS — BP 134/76 | HR 77 | Ht 69.0 in | Wt 222.0 lb

## 2019-05-28 DIAGNOSIS — R0789 Other chest pain: Secondary | ICD-10-CM

## 2019-05-28 DIAGNOSIS — Z8619 Personal history of other infectious and parasitic diseases: Secondary | ICD-10-CM | POA: Diagnosis not present

## 2019-05-28 DIAGNOSIS — Z8616 Personal history of COVID-19: Secondary | ICD-10-CM | POA: Insufficient documentation

## 2019-05-28 DIAGNOSIS — F411 Generalized anxiety disorder: Secondary | ICD-10-CM

## 2019-05-28 NOTE — Progress Notes (Signed)
Cardiology Consultation:    Date:  05/28/2019   ID:  DAK SZUMSKI, DOB 16-Jan-2001, MRN [id number]  PCP:  [facility]  Cardiologist:  Jenne Campus, MD   Referring MD: [facility]   Chief Complaint  Patient presents with   Follow up from The ER    History of Present Illness:    Lucas Sherman is a 18 y.o. male who is being seen today for the evaluation of chest pain at the request of [facility]  Is a 18 years old gentleman with last medical history significant for anxiety.  About 2 months ago he had COVID-19 infection.  He is entire follow-up will he was sick.  His manifestation of it was GI symptoms he was throwing up as well as have diarrhea.  Actually required visits in the emergency room and the rehydration during the process.  He recovered nicely after that but still have some issues.  Issue is exertional shortness of breath as well as some atypical chest pain.  He described pain on the left side of his chest stopping like lasting only for seconds it interesting happening sometimes when he exercise.  He used to exercise on a regular basis but now seems COVID-19 his ability to exercise is significantly diminished.  Of course he does have anxiety which make the situation worse he is very worried about the fact that something is happening to him.  Does not have any swelling of lower extremities no proximal nocturnal dyspnea no palpitations.  Past Medical History:  Diagnosis Date   Anxiety    COVID-19     Past Surgical History:  Procedure Laterality Date   ELBOW SURGERY      Current Medications: Current Meds  Medication Sig   clonazePAM (KLONOPIN) 0.5 MG tablet Take 1 tablet (0.5 mg total) by mouth 3 (three) times daily as needed for anxiety.   escitalopram (LEXAPRO) 20 MG tablet TAKE 1 TABLET DAILY   gabapentin (NEURONTIN) 100 MG capsule Take 2 capsules (200 mg total) by mouth 2 (two) times daily.      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 on file   Food insecurity    Worry: Never true    Inability: Never true   Transportation needs    Medical: No    Non-medical: No  Tobacco Use   Smoking status: Never Smoker   Smokeless tobacco: Never Used  Substance and Sexual Activity   Alcohol use: Yes    Comment: occasional   Drug use: Not Currently    Types: Marijuana   Sexual activity: Not on file  Lifestyle   Physical activity    Days per week: Not on file    Minutes per session: Not on file   Stress: Rather much  Relationships   Social connections    Talks on phone: Not on file    Gets together: Not on file    Attends religious service: Not on file    Active member of club or organization: Not on file    Attends meetings of clubs or organizations: Not on file    Relationship status:  Not on file  Other Topics Concern   Not on file  Social History Narrative   Scientist, 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  He describes restless legs in his sleep patterns having to get up for a hot bath or stretches to his legs in order to try to get back to sleep.  Despite longstanding Lexapro and new onset Klonopin, he fears and finds instability still in his illness pattern that undermines his performance in baseball  and academics.  He leaves for college 05/18/2019 seeking intervention prior to then after PCP has recently started the Klonopin which helped but affecting tolerance side effects if dosed higher to prevent anxiety or panic.     Family History: The patient's family history includes Alcohol abuse in an other family member; Anxiety disorder in his brother and mother; Depression in his maternal grandmother, mother, and another family member; Panic disorder in his brother. There is no history of Arthritis, Asthma, Birth defects, Cancer, COPD, Diabetes, Drug abuse, Early death, Hearing loss, Heart disease, Hyperlipidemia, Learning disabilities, Kidney disease, Hypertension, Mental illness, Miscarriages / Stillbirths, Mental retardation, Stroke, Vision loss, or Varicose Veins. ROS:   Please see the history of present illness.    All 14 point review of systems negative except as described per history of present illness.  EKGs/Labs/Other Studies Reviewed:    The following studies were reviewed today: EKG done on the 17th of this month in the emergency room showed normal sinus rhythm, normal P interval, normal QS complex duration morphology, nonspecific ST segment changes.    Recent Labs: 04/08/2019: ALT 57 05/09/2019: BUN 10; Creatinine, Ser 1.23; Hemoglobin 16.1; Platelets 297; Potassium 3.9; Sodium 138  Recent Lipid Panel No results found for: CHOL, TRIG, HDL, CHOLHDL, VLDL, LDLCALC, LDLDIRECT  Physical Exam:    VS:  BP 134/76    Pulse 77    Ht 5\' 9"  (1.753 m)    Wt 222 lb (100.7 kg)    SpO2 99%    BMI 32.78 kg/m     Wt Readings from Last 3 Encounters:  05/28/19 222 lb (100.7 kg)  05/15/19 220 lb (99.8 kg)  05/09/19 217 lb (98.4 kg)     GEN:  Well nourished, well developed in no acute distress HEENT: Normal NECK: No JVD; No carotid bruits LYMPHATICS: No lymphadenopathy CARDIAC: RRR, no murmurs, no rubs, no gallops RESPIRATORY:  Clear to auscultation without rales, wheezing or rhonchi   ABDOMEN: Soft, non-tender, non-distended MUSCULOSKELETAL:  No edema; No deformity  SKIN: Warm and dry NEUROLOGIC:  Alert and oriented x 3 PSYCHIATRIC:  Normal affect   ASSESSMENT:    1. Generalized anxiety disorder   2. Atypical chest pain   3. History of 2019 novel coronavirus disease (COVID-19)    PLAN:    In order of problems listed above:  1. Atypical chest pain.  Stabbing sharp I doubt very much that this is related to coronary artery disease he does not have other risk factors for it.  No hypertension no diabetes he is not sure what his cholesterol is he never smoked.  With my concern is the fact that he did have COVID-19 infection before and does have some shortness of breath.  I will  schedule him to have an echocardiogram.  Based on that we will decide what to do next. 2. General anxiety disorder very well controlled with medications right now. 3. History of COVID-19 infection we may be simply asked dealing with long recovery after that infection.  He said that he was very sick when he had it.   Medication Adjustments/Labs and Tests Ordered: Current medicines are reviewed at length with the patient today.  Concerns regarding medicines are outlined above.  No orders of the defined types were placed in this encounter.  No orders of the defined types were placed in this encounter.   Signed, Georgeanna Leaobert J. Viaan Knippenberg, MD, [facility]ACC. 05/28/2019 10:13 AM    [facility]

## 2019-05-28 NOTE — Patient Instructions (Signed)
Medication Instructions:  Your physician recommends that you continue on your current medications as directed. Please refer to the Current Medication list given to you today.  If you need a refill on your cardiac medications before your next appointment, please call your pharmacy.   Lab work: None.  If you have labs (blood work) drawn today and your tests are completely normal, you will receive your results only by: . MyChart Message (if you have MyChart) OR . A paper copy in the mail If you have any lab test that is abnormal or we need to change your treatment, we will call you to review the results.  Testing/Procedures: Your physician has requested that you have an echocardiogram. Echocardiography is a painless test that uses sound waves to create images of your heart. It provides your doctor with information about the size and shape of your heart and how well your heart's chambers and valves are working. This procedure takes approximately one hour. There are no restrictions for this procedure.    Follow-Up: At [facility], you and your health needs are our priority.  As part of our continuing mission to provide you with exceptional heart care, we have created designated Provider Care Teams.  These Care Teams include your primary Cardiologist (physician) and Advanced Practice Providers (APPs -  Physician Assistants and Nurse Practitioners) who all work together to provide you with the care you need, when you need it. You will need a follow up appointment in 1 months.  Please call our office 2 months in advance to schedule this appointment.  You may see No primary care provider on file. or another member of our [facility] Provider Team in [place]: Brian Munley, MD . Rajan Revankar, MD  Any Other Special Instructions Will Be Listed Below (If Applicable).   Echocardiogram An echocardiogram is a procedure that uses painless sound waves (ultrasound) to produce an image of the  heart. Images from an echocardiogram can provide important information about:  Signs of coronary artery disease (CAD).  Aneurysm detection. An aneurysm is a weak or damaged part of an artery wall that bulges out from the normal force of blood pumping through the body.  Heart size and shape. Changes in the size or shape of the heart can be associated with certain conditions, including heart failure, aneurysm, and CAD.  Heart muscle function.  Heart valve function.  Signs of a past heart attack.  Fluid buildup around the heart.  Thickening of the heart muscle.  A tumor or infectious growth around the heart valves. Tell a health care provider about:  Any allergies you have.  All medicines you are taking, including vitamins, herbs, eye drops, creams, and over-the-counter medicines.  Any blood disorders you have.  Any surgeries you have had.  Any medical conditions you have.  Whether you are pregnant or may be pregnant. What are the risks? Generally, this is a safe procedure. However, problems may occur, including:  Allergic reaction to dye (contrast) that may be used during the procedure. What happens before the procedure? No specific preparation is needed. You may eat and drink normally. What happens during the procedure?   An IV tube may be inserted into one of your veins.  You may receive contrast through this tube. A contrast is an injection that improves the quality of the pictures from your heart.  A gel will be applied to your chest.  A wand-like tool (transducer) will be moved over your chest. The gel will help   to transmit the sound waves from the transducer.  The sound waves will harmlessly bounce off of your heart to allow the heart images to be captured in real-time motion. The images will be recorded on a computer. The procedure may vary among health care providers and hospitals. What happens after the procedure?  You may return to your normal, everyday  life, including diet, activities, and medicines, unless your health care provider tells you not to do that. Summary  An echocardiogram is a procedure that uses painless sound waves (ultrasound) to produce an image of the heart.  Images from an echocardiogram can provide important information about the size and shape of your heart, heart muscle function, heart valve function, and fluid buildup around your heart.  You do not need to do anything to prepare before this procedure. You may eat and drink normally.  After the echocardiogram is completed, you may return to your normal, everyday life, unless your health care provider tells you not to do that. This information is not intended to replace advice given to you by your health care provider. Make sure you discuss any questions you have with your health care provider. Document Released: 09/14/2000 Document Revised: 01/08/2019 Document Reviewed: 10/20/2016 Elsevier Patient Education  2020 Elsevier Inc.  '  

## 2019-05-28 NOTE — Addendum Note (Signed)
Addended by: Ashok Norris on: 05/28/2019 10:25 AM   Modules accepted: Orders

## 2019-05-29 ENCOUNTER — Ambulatory Visit (HOSPITAL_BASED_OUTPATIENT_CLINIC_OR_DEPARTMENT_OTHER)
Admission: RE | Admit: 2019-05-29 | Discharge: 2019-05-29 | Disposition: A | Discharge status: Home / Self Care | Payer: Commercial Managed Care - PPO | Source: Ambulatory Visit | Attending: Cardiology | Admitting: Cardiology

## 2019-05-29 DIAGNOSIS — R0789 Other chest pain: Secondary | ICD-10-CM | POA: Insufficient documentation

## 2019-05-29 NOTE — Progress Notes (Signed)
  Echocardiogram 2D Echocardiogram has been performed.  Cardell Peach 05/29/2019, 2:58 PM

## 2019-05-29 NOTE — Progress Notes (Signed)
  Echocardiogram 2D Echocardiogram has been performed.  Lucas Sherman 05/29/2019, 2:57 PM

## 2019-05-30 ENCOUNTER — Other Ambulatory Visit: Payer: Self-pay | Admitting: Psychiatry

## 2019-05-30 DIAGNOSIS — F410 Panic disorder [episodic paroxysmal anxiety] without agoraphobia: Secondary | ICD-10-CM

## 2019-05-30 DIAGNOSIS — G2581 Restless legs syndrome: Secondary | ICD-10-CM

## 2019-06-03 ENCOUNTER — Encounter

## 2019-06-03 ENCOUNTER — Ambulatory Visit (INDEPENDENT_AMBULATORY_CARE_PROVIDER_SITE_OTHER): Payer: Commercial Managed Care - PPO | Admitting: Psychiatry

## 2019-06-03 ENCOUNTER — Other Ambulatory Visit: Payer: Self-pay

## 2019-06-03 DIAGNOSIS — F411 Generalized anxiety disorder: Secondary | ICD-10-CM

## 2019-06-03 NOTE — Progress Notes (Signed)
    [facility] Counselor/Therapist Progress Note  Patient ID: Lucas Sherman, MRN: [id number],    Date: 06/03/2019  Time Spent:  60 minutes   11:00am to 12:00noon  Treatment Type: Individual Therapy  Reported Symptoms:  Anxiety, mild depression  Mental Status Exam:  Appearance:   Casual     Behavior:  Appropriate and Sharing  Motor:  Normal  Speech/Language:   Clear and Coherent  Affect:  anxious  Mood:  anxious  Thought process:  normal  Thought content:    WNL  Sensory/Perceptual disturbances:    WNL  Orientation:  oriented to person, place, time/date, situation, day of week, month of year and year  Attention:  Good  Concentration:  Good  Memory:  WNL  Fund of knowledge:   Good  Insight:    Good  Judgment:   Good  Impulse Control:  Good   Risk Assessment: Danger to Self:  No Self-injurious Behavior: No Danger to Others: No Duty to Warn:no Physical Aggression / Violence:No  Access to Firearms a concern: No  Gang Involvement:No   Subjective:   Patient in today and reports his anxiety is a bit better today but he's been more depressed recently although states it is mild except when "I get to thinking about it and sometimes overthinking and not having my friends at home is difficult".  Self-talk can be "worried and negative".  He is an extrovert and very much affected when he is not around other people, although he adds that he wants to be able to adapt in these situations. He is a sophomore in college and staying at home this semester and doing college online and planning to return to [employer] in the spring semester when he will be playing baseball again.    Interventions: Cognitive Behavioral Therapy  Diagnosis:   ICD-10-CM   1. Generalized anxiety disorder  F41.1     Plan:   Treatment Goals: Long Term Goal: 1. Reduce overall level, frequency, and intensity of his anxiety so that daily     functioning is not impaired.    Strategy:     Patient will  work to interrupt anxious thought patterns and replace them       with more calming, affirming thought patterns that support effective daily     functioning. Will process this in session.      (Progressing)           Short Term Goals:   2. Identify an anxiety coping mechanism that has been successful in the       past and increase its use now.     Strategy:    Patient will reflect on past coping skills he has used that were helpful          and try those same coping skills with current anxiety.  Will process             results in session.     (Progressing)     Next appt is within 1-2 weeks.   Shanon Ace, LCSW

## 2019-06-09 ENCOUNTER — Ambulatory Visit: Payer: Commercial Managed Care - PPO | Admitting: Psychiatry

## 2019-06-18 ENCOUNTER — Ambulatory Visit (INDEPENDENT_AMBULATORY_CARE_PROVIDER_SITE_OTHER): Payer: Commercial Managed Care - PPO | Admitting: Psychiatry

## 2019-06-18 ENCOUNTER — Encounter: Payer: Self-pay | Admitting: Psychiatry

## 2019-06-18 ENCOUNTER — Other Ambulatory Visit: Payer: Self-pay

## 2019-06-18 VITALS — Ht 69.0 in | Wt 216.0 lb

## 2019-06-18 DIAGNOSIS — F410 Panic disorder [episodic paroxysmal anxiety] without agoraphobia: Secondary | ICD-10-CM | POA: Diagnosis not present

## 2019-06-18 DIAGNOSIS — F411 Generalized anxiety disorder: Secondary | ICD-10-CM | POA: Diagnosis not present

## 2019-06-18 DIAGNOSIS — G2581 Restless legs syndrome: Secondary | ICD-10-CM

## 2019-06-18 MED ORDER — CLONAZEPAM 0.5 MG PO TABS: 0.5000 mg | ORAL_TABLET | Freq: Three times a day (TID) | ORAL | 0 refills | Status: DC | PRN

## 2019-06-18 MED ORDER — GABAPENTIN 100 MG PO CAPS: 200.0000 mg | ORAL_CAPSULE | Freq: Two times a day (BID) | ORAL | 0 refills | Status: DC

## 2019-06-18 NOTE — Progress Notes (Deleted)
Office Visit    Patient Name: Lucas Sherman Date of Encounter: 06/18/2019  Primary Care Provider:  [facility] Primary Cardiologist:  No primary care provider on file. Electrophysiologist:  None   Chief Complaint    Lucas Sherman is a 18 y.o. male with a hx of *** presents today for ***   Past Medical History    Past Medical History:  Diagnosis Date  . Anxiety   . COVID-19    Past Surgical History:  Procedure Laterality Date  . ELBOW SURGERY     Allergies  Allergies  Allergen Reactions  . Reglan [Metoclopramide]     "anxiety"    History of Present Illness    Lucas Sherman is a 18 y.o. male with a hx of anxiety, chest pain, COVID 19 last seen by Dr. Agustin Cree 05/28/19.   He was COVID positive 04/04/19. His COVID19 course was notable for GI symptoms. At visit with PCP 05/21/19 left sided chest pain noted to be reproducible on exam and associated with workout after pre-workout supplement with 325mg  of caffeine. At his visit with Dr. Drue Dun 05/28/19 he described atypical chest pain - he was ordered for an echocardiogram due to previous COVID19 infection and some SOB.   Echocardiogram 05/29/19 with EF 60-65%, normal LV diastolic parameters, RV normal size/function, no valvular abnormalities.   He is a Museum/gallery exhibitions officer and plays baseball.   EKGs/Labs/Other Studies Reviewed:   The following studies were reviewed today:  Echo 05/29/19 1. The left ventricle has normal systolic function with an ejection fraction of 60-65%. The cavity size was normal. Left ventricular diastolic parameters were normal.  2. The right ventricle has normal systolic function. The cavity was normal. There is no increase in right ventricular wall thickness.  3. The aorta is normal unless otherwise noted.   FINDINGS  Left Ventricle: The left ventricle has normal systolic function, with an ejection fraction of 60-65%. The cavity size was normal. There is no  increase in left ventricular wall thickness. Left ventricular diastolic parameters were normal.   Right Ventricle: The right ventricle has normal systolic function. The cavity was normal. There is no increase in right ventricular wall thickness.   Left Atrium: Left atrial size was normal in size.   Right Atrium: Right atrial size was normal in size. Right atrial pressure is estimated at 3 mmHg.   Interatrial Septum: No atrial level shunt detected by color flow Doppler.   Pericardium: There is no evidence of pericardial effusion.   Mitral Valve: The mitral valve is normal in structure. Mitral valve regurgitation was not assessed by color flow Doppler.   Tricuspid Valve: The tricuspid valve is normal in structure. Tricuspid valve regurgitation is trivial by color flow Doppler.   Aortic Valve: The aortic valve is normal in structure. Aortic valve regurgitation was not assessed by color flow Doppler.   Pulmonic Valve: The pulmonic valve was normal in structure. Pulmonic valve regurgitation was not assessed by color flow Doppler.   Aorta: The aorta is normal unless otherwise noted.   Venous: The inferior vena cava measures 1.29 cm, is normal in size with greater than 50% respiratory variability.  EKG:  EKG independently reviewed from 05/18/19 shows NSR with no acute ST/T wave changes.   Recent Labs: 04/08/2019: ALT 57 05/09/2019: BUN 10; Creatinine, Ser 1.23; Hemoglobin 16.1; Platelets 297; Potassium 3.9; Sodium 138  04/27/19 via Care Everywhere: TSH 2.47 05/21/19 via Care Everywhere: Creatinine 0.94, GFR 116, K  140, K 4.5, AST 17, ALT 28 Recent Lipid Panel No results found for: CHOL, TRIG, HDL, CHOLHDL, VLDL, LDLCALC, LDLDIRECT  Home Medications   No outpatient medications have been marked as taking for the 06/19/19 encounter (Appointment) with Alver SorrowWalker, Stacy Deshler S, NP.    Review of Systems      ROS All other systems reviewed and are otherwise negative except as noted above.  Physical  Exam    VS:  There were no vitals taken for this visit. , BMI There is no height or weight on file to calculate BMI. GEN: Well nourished, well developed, in no acute distress. HEENT: normal. Neck: Supple, no JVD, carotid bruits, or masses. Cardiac: ***RRR, no murmurs, rubs, or gallops. No clubbing, cyanosis, edema.  ***Radials/DP/PT 2+ and equal bilaterally.  Respiratory:  ***Respirations regular and unlabored, clear to auscultation bilaterally. GI: Soft, nontender, nondistended, BS + x 4. MS: No deformity or atrophy. Skin: Warm and dry, no rash. Neuro:  Strength and sensation are intact. Psych: Normal affect.  Assessment & Plan    1. Atypical chest pain -  2. SOB -  3. Anxiety - Continue to follow with PCP and therapy.   Disposition: Follow up *** on an as-needed basis.   Alver Sorrowaitlin S Larsen Dungan, NP 06/18/2019, 2:43 PM

## 2019-06-18 NOTE — Progress Notes (Signed)
[facility]  Patient ID: Lucas Sherman,  MRN: [id number]  [facility]P: [facility]  Date of Evaluation: 06/18/2019 Time spent:20 minutes  Chief Complaint:  Chief Complaint    Anxiety; Panic Attack; Stress      HISTORY/CURRENT STATUS: Lucas Sherman is seen onsite in office 20 minutes face-to-face individually with consent with epic collateral for psychiatric interview and exam in 4-week evaluation and management of generalized and panic anxiety and restless leg syndrome.  In the interim, the patient has completed his assessment of chest pain and GI distress stating his stomach and chest are better having normal liver functions recently and normal echo and EKG concluding chest wall pain and his dyspepsia.  His goal weight is 205 pounds now down to 216.  His morning workouts help him through the day but he is now having maximum anxiety and panic at night after 8 PM.  He is taking his gabapentin morning and night now but has been limiting the Klonopin previously just taking the morning dose but now deciding he will take 2 of the 0.5 mg tablets in the evening so that he can combat the anxiety specially if changing his workouts for baseball to evening after supper are not sufficient.  He will not return to [employer] this fall but will start baseball when the season is available as he is working out and Dietitianpreparing.  This is his friends from school and activities socially.  He has changed therapist from last session with Rockne Menghiniebbie Dowd, LCSW on 06/03/2019 now changing to brother's [facility], [facility] that she will continue even when returning to school next semester.  He has no suicidality, mania, psychosis or delirium  Anxiety Presents for initial visit. Onset was more than 5 years ago. The problem has been waxing and waning. Symptoms include chest pain,  COVID fatigue and congestion better, stomach fullness better pain better, excessive worry,  hyperventilation, insomnia, muscle tension, nervous/anxious behavior, palpitations and panic. Patient reports no compulsions, decreased concentration, depressed mood, nausea or suicidal ideas. Symptoms occur most days. The severity of symptoms is interfering with daily activities, causing significant distress and moderate. The symptoms are aggravated by family issues, caffeine, medication, social activities and work stress. The quality of sleep is poor. Nighttime awakenings: several.   Individual Medical History/ Review of Systems: Changes? :Yes EKG, echo cardiogram and cardiology consultation have been intact for resuming baseball and absence of limitation for activity in future.  He is capable to continue his medications of Klonopin, Neurontin, and Lexapro.  Allergies: Reglan [metoclopramide]  Current Medications:  Current Outpatient Medications:  .  clonazePAM (KLONOPIN) 0.5 MG tablet, Take 1 tablet (0.5 mg total) by mouth 3 (three) times daily as needed for anxiety., Disp: 90 tablet, Rfl: 0 .  escitalopram (LEXAPRO) 20 MG tablet, TAKE 1 TABLET DAILY, Disp: 90 tablet, Rfl: 3 .  gabapentin (NEURONTIN) 100 MG capsule, Take 2 capsules (200 mg total) by mouth 2 (two) times daily., Disp: 120 capsule, Rfl: 0   Medication Side Effects: none  Family Medical/ Social History: Changes? No  MENTAL HEALTH EXAM:  Height 5\' 9"  (1.753 m), weight 216 lb (98 kg).Body mass index is 31.9 kg/m. Muscle strengths and tone 5/5, postural reflexes and gait 0/0, and AIMS = 0.  Others deferred for coronavirus shutdown  General Appearance: Casual, Fairly Groomed, Guarded, Meticulous and Obese  Eye Contact:  Fair  Speech:  Clear and Coherent, Normal Rate, Pressured and Talkative  Volume:  Normal  Mood:  Angry, Anxious, Dysphoric, Euthymic, Irritable and Worthless  Affect:  Congruent, Inappropriate, Full Range, Restricted and Anxious  Thought Process:  Coherent, Goal Directed, Irrelevant and Descriptions of  Associations: Tangential  Orientation:  Full (Time, Place, and Person)  Thought Content: Logical, Ilusions, Obsessions, Paranoid Ideation and Rumination   Suicidal Thoughts:  No  Homicidal Thoughts:  No  Memory:  Immediate;   Good Remote;   Good  Judgement:  Fair  Insight:  Fair and Lacking  Psychomotor Activity:  Normal, Increased, Mannerisms and Restlessness  Concentration:  Concentration: Fair and Attention Span: Poor  Recall:  Coleman of Knowledge: Good  Language: Fair  Assets:  Desire for Improvement Leisure Time Physical Health Resilience Talents/Skills Vocational/Educational  ADL's:  Intact  Cognition: WNL  Prognosis:  Fair    DIAGNOSES:    ICD-10-CM   1. Generalized anxiety disorder  F41.1 clonazePAM (KLONOPIN) 0.5 MG tablet  2. Panic disorder  F41.0 clonazePAM (KLONOPIN) 0.5 MG tablet    gabapentin (NEURONTIN) 100 MG capsule  3. Restless legs syndrome  G25.81 gabapentin (NEURONTIN) 100 MG capsule    Receiving Psychotherapy: Yes change to Rosalia Hammers, LPC at [facility]   RECOMMENDATIONS: The patient envisions being already underway with enthusiasm and interest, which seems to make him more vulnerable to interruption by anxiety nsimply functioning as usual.  However the patient does not define in a predictive fashion as much as an retreating fashion.  We attempt to rework cognitive  behavioral therapy social skills, frustration management, behavioral nutrition and sleep hygiene to focus on the evening after supper for containment of anxiety.  Klonopin is E scribed to 0.5 mg 3 times daily though taking it predominantly in the future as 2 tablets total 1 mg every bedtime and the other as needed sent as #90 with no refill E [facility] [address] [address] for generalized anxiety and panic.  He continues gabapentin 100 mg E scribed as 2 capsules total 200 mg morning and bedtime sent as #120 with [facility] [address] for generalized and  panic anxiety and restless leg syndrome.  He has current supply of Lexapro 20 mg every morning from PCP peds.  He returns in 6 weeks for follow-up continuing therapy.   Delight Hoh, MD

## 2019-06-19 ENCOUNTER — Ambulatory Visit: Payer: Commercial Managed Care - PPO | Admitting: Family

## 2019-07-30 ENCOUNTER — Other Ambulatory Visit: Payer: Self-pay

## 2019-07-30 ENCOUNTER — Encounter: Payer: Self-pay | Admitting: Psychiatry

## 2019-07-30 ENCOUNTER — Ambulatory Visit (INDEPENDENT_AMBULATORY_CARE_PROVIDER_SITE_OTHER): Payer: Commercial Managed Care - PPO | Admitting: Psychiatry

## 2019-07-30 VITALS — Ht 69.0 in | Wt 218.0 lb

## 2019-07-30 DIAGNOSIS — F411 Generalized anxiety disorder: Secondary | ICD-10-CM | POA: Diagnosis not present

## 2019-07-30 DIAGNOSIS — G2581 Restless legs syndrome: Secondary | ICD-10-CM

## 2019-07-30 DIAGNOSIS — F4323 Adjustment disorder with mixed anxiety and depressed mood: Secondary | ICD-10-CM | POA: Diagnosis not present

## 2019-07-30 DIAGNOSIS — F410 Panic disorder [episodic paroxysmal anxiety] without agoraphobia: Secondary | ICD-10-CM

## 2019-07-30 MED ORDER — CLONAZEPAM 0.5 MG PO TABS: 0.5000 mg | ORAL_TABLET | Freq: Three times a day (TID) | ORAL | 0 refills | Status: DC | PRN

## 2019-07-30 MED ORDER — GABAPENTIN 100 MG PO CAPS: 200.0000 mg | ORAL_CAPSULE | Freq: Two times a day (BID) | ORAL | 1 refills | Status: DC

## 2019-07-30 MED ORDER — BUPROPION HCL ER (XL) 150 MG PO TB24: 150.0000 mg | ORAL_TABLET | Freq: Every day | ORAL | 0 refills | Status: DC

## 2019-07-30 NOTE — Progress Notes (Signed)
[facility]  Patient ID: Lucas Sherman,  MRN: [id number]  [facility]P: [facility]  Date of Evaluation: 07/30/2019 Time spent:20 minutes from 1100 to 1120  Chief Complaint:  Chief Complaint    Anxiety; Panic Attack; Depression      HISTORY/CURRENT STATUS: Lucas Sherman is seen onsite in office 20 minutes face-to-face with consent with epic collateral for psychiatric interview and exam in 6-week evaluation and management of panic and generalized anxiety which have significantly improved now having residual let down dysphoria not attending other than on line school this semester but planning to return to [employer] college in January. Lucas Sherman has been working out [employer] and helping a friend, but he does not experience the usual joy as much having to get himself out of bed in the morning sleeping a lot now.  He always feels somewhat restless as though guilty as though something not done that he needed to do.  Anxiety is much less with no panic, and he has tapered his Klonopin 0.5 mg tablets from 2 at bedtime to 1 every bedtime now one half every bedtime.  He has a difficult time initiating homework at 6 PM as though it is not easy.  Melatonin 15 mg at night helps somewhat in place of the Klonopin as he continues his Lexapro and gabapentin though sometimes taking the gabapentin only in the morning.  He is seeing Lucas Sherman, [facility] for therapy who suspects possible ADHD has become evident as anxiety and panic are improved.  The patient has less of the compensatory conclusion that he will just go to the pros in baseball or transfer to a class 4 university.  Mother advises that he try Wellbutrin as she did for symptoms persisting despite taking only Lexapro.  Patient did not respond successfully to Pristiq prior to switching here for his medication management 3 months ago.  He has no mania, suicidality, psychosis, or delirium, and clinically his depression seems more  situationally reactive than primary and sustained.   Anxiety  Presents forinitialvisit. Onset wasmore than 5 years ago. The problem has beenwaxing and waning, now anxiety improving the last 3 months with let down dysphoria the last 6 weeks. Symptoms includedecreased concentration,depressed mood, low energy, mild anhedonia, restlessness, excessive worry,and nervous/anxious behavior. Patient reports noinsomnia,no muscle tension, no compulsions, no chest pain, no hyperventilation, nopalpitations, no fatigue, no congestion, no stomach pain,no nausea, no panic, and no suicidal ideas. Symptoms occurmost days. The severity of symptoms isinterfering with daily activities, causing significant distress and moderate. The symptoms are aggravated byfamily issues, caffeine, medication, social activities and work stress. The quality of sleep ispoor. Nighttime awakenings:several.   Individual Medical History/ Review of Systems: Changes? :Yes It is back up 2 pounds to the 218 pounds when he had a goal weight of 205 pounds.  Allergies: Reglan [metoclopramide]  Current Medications:  Current Outpatient Medications:  .  buPROPion (WELLBUTRIN XL) 150 MG 24 hr tablet, Take 1 tablet (150 mg total) by mouth daily after breakfast., Disp: 90 tablet, Rfl: 0 .  clonazePAM (KLONOPIN) 0.5 MG tablet, Take 1 tablet (0.5 mg total) by mouth 3 (three) times daily as needed for anxiety., Disp: 90 tablet, Rfl: 0 .  escitalopram (LEXAPRO) 20 MG tablet, TAKE 1 TABLET DAILY, Disp: 90 tablet, Rfl: 3 .  gabapentin (NEURONTIN) 100 MG capsule, Take 2 capsules (200 mg total) by mouth 2 (two) times daily., Disp: 120 capsule, Rfl: 1   Medication Side Effects: none unless Lexapro is pooping out  Family Medical/ Social History: Changes? No as brother is doing very well now as is mother.  MENTAL HEALTH EXAM:  Height 5\' 9"  (1.753 m), weight 218 lb (98.9 kg).Body mass index is 32.19 kg/m. Muscle strengths and tone 5/5,  postural reflexes and gait 0/0, and AIMS = 0 others deferred for coronavirus shutdown  General Appearance: Casual, Fairly Groomed and Obese  Eye Contact:  Good  Speech:  Clear and Coherent, Normal Rate and Talkative  Volume:  Normal  Mood:  Anxious, Dysphoric, Euthymic, Irritable and Worthless  Affect:  Congruent, Depressed, Inappropriate, Full Range and Anxious  Thought Process:  Coherent, Goal Directed, Irrelevant and Descriptions of Associations: Circumstantial  Orientation:  Full (Time, Place, and Person)  Thought Content: Ilusions, Obsessions and Rumination   Suicidal Thoughts:  No  Homicidal Thoughts:  No  Memory:  Immediate;   Good Remote;   Good  Judgement:  Fair  Insight:  Fair  Psychomotor Activity:  Normal, Increased, Decreased, Mannerisms and Restlessness  Concentration:  Concentration: Fair and Attention Span: Good  Recall:  Good  Fund of Knowledge: Good  Language: Good  Assets:  Desire for Improvement Leisure Time Physical Health Resilience Social Support Talents/Skills  ADL's:  Intact  Cognition: WNL  Prognosis:  Good    DIAGNOSES:    ICD-10-CM   1. Generalized anxiety disorder  F41.1 clonazePAM (KLONOPIN) 0.5 MG tablet    buPROPion (WELLBUTRIN XL) 150 MG 24 hr tablet  2. Panic disorder  F41.0 gabapentin (NEURONTIN) 100 MG capsule    clonazePAM (KLONOPIN) 0.5 MG tablet  3. Restless legs syndrome  G25.81 gabapentin (NEURONTIN) 100 MG capsule  4. Adjustment disorder with mixed anxiety and depressed mood  F43.23 buPROPion (WELLBUTRIN XL) 150 MG 24 hr tablet    Receiving Psychotherapy: Yes He has transferred psychotherapy to brother's therapist Lucas Sherman, [facility] going well   RECOMMENDATIONS: Depressive symptoms are currently more adjustment than primary self-sustaining mood disorder.  His over focus on certain concerns to the relative exclusion of others is more based in anxiety and cluster C traits.  However the addition of Wellbutrin can certainly be  helpful at this time when he is reducing Klonopin in ways that may allow discontinuation.  Psychosupportive psychoeducation updates cognitive behavioral nutrition, object relations, and frustration management for achieving back to usual executive function by January start up of onsite school.  He is E scribed Wellbutrin 150 mg XL every morning as a 90-day supply with no refill to [facility] [place] for adjustment disorder with anxiety and depressed mood and generalized anxiety.  He continues Lexapro 20 mg every bedtime having a year supply from 4 panic and generalized anxiety disorders.  He is E scribed Neurontin 100 mg taking 2 capsules morning and evening though largely taking only the morning dose recently sent as #120 with 1 refill to [facility] though taking it both morning and night he may be able to stop the as needed Klonopin for panic and generalized anxiety.  Klonopin is renewed 0.5 mg 3 times daily as needed for anxiety sent as #90 with no refill to [facility] [place].  He returns in 6 to 8 weeks for follow-up, and he is provided education on the prevention and monitoring and safety hygiene for Wellbutrin   Delight Hoh, MD

## 2019-08-18 ENCOUNTER — Other Ambulatory Visit: Payer: Self-pay

## 2019-08-18 DIAGNOSIS — F411 Generalized anxiety disorder: Secondary | ICD-10-CM

## 2019-08-18 DIAGNOSIS — F4323 Adjustment disorder with mixed anxiety and depressed mood: Secondary | ICD-10-CM

## 2019-08-18 MED ORDER — BUPROPION HCL ER (XL) 150 MG PO TB24: 150.0000 mg | ORAL_TABLET | Freq: Every day | ORAL | 0 refills | Status: DC

## 2019-09-19 ENCOUNTER — Other Ambulatory Visit: Payer: Self-pay | Admitting: Psychiatry

## 2019-09-19 DIAGNOSIS — F411 Generalized anxiety disorder: Secondary | ICD-10-CM

## 2019-09-19 DIAGNOSIS — F4323 Adjustment disorder with mixed anxiety and depressed mood: Secondary | ICD-10-CM

## 2019-09-20 NOTE — Telephone Encounter (Signed)
90 day submitted 11/17

## 2019-10-15 ENCOUNTER — Other Ambulatory Visit: Payer: Self-pay | Admitting: Psychiatry

## 2019-10-15 DIAGNOSIS — G2581 Restless legs syndrome: Secondary | ICD-10-CM

## 2019-10-15 DIAGNOSIS — F411 Generalized anxiety disorder: Secondary | ICD-10-CM

## 2019-10-15 DIAGNOSIS — F410 Panic disorder [episodic paroxysmal anxiety] without agoraphobia: Secondary | ICD-10-CM

## 2019-10-15 NOTE — Telephone Encounter (Signed)
Last visit 07/30/2019 was due for 6-8 week f/u

## 2019-10-15 NOTE — Telephone Encounter (Signed)
Last appointment 07/30/2019 with last dispensing Klonopin 06/18/2019 due for office appointment but appropriate to renew Klonopin 0.5 mg 3 times daily as needed #90 and Neurontin 100 mg as 2 capsules total 200 mg twice daily #120 with no refill for either sent to [facility] [place]

## 2019-10-19 ENCOUNTER — Telehealth: Payer: Self-pay | Admitting: Psychiatry

## 2019-10-19 NOTE — Telephone Encounter (Signed)
[facility] sends their warning about using Wellbutrin and Klonopin in a patient that may have a seizure disorder as to threshold and precipitants.  I can find no evidence that the patient has a seizure disorder, though he is less compliant with medications and follow-up lately while brother states the patient is doing reasonably well but not back to college.

## 2019-12-14 ENCOUNTER — Other Ambulatory Visit: Payer: Self-pay | Admitting: Psychiatry

## 2019-12-14 DIAGNOSIS — F411 Generalized anxiety disorder: Secondary | ICD-10-CM

## 2019-12-14 DIAGNOSIS — F410 Panic disorder [episodic paroxysmal anxiety] without agoraphobia: Secondary | ICD-10-CM

## 2019-12-14 NOTE — Telephone Encounter (Signed)
Last apt 07/2019 

## 2019-12-14 NOTE — Telephone Encounter (Signed)
Last seen 07/30/2019 due back in 2 months still requesting refills Wellbutrin and Klonopin so will start reducing the Klonopin on directions not following up in office with anticipation of discontinuation with last 30-day supply lasting 7 weeks.

## 2019-12-15 ENCOUNTER — Other Ambulatory Visit: Payer: Self-pay

## 2019-12-15 DIAGNOSIS — G2581 Restless legs syndrome: Secondary | ICD-10-CM

## 2019-12-15 DIAGNOSIS — F410 Panic disorder [episodic paroxysmal anxiety] without agoraphobia: Secondary | ICD-10-CM

## 2019-12-15 MED ORDER — GABAPENTIN 100 MG PO CAPS: 200.0000 mg | ORAL_CAPSULE | Freq: Two times a day (BID) | ORAL | 0 refills | Status: DC

## 2020-01-26 ENCOUNTER — Other Ambulatory Visit: Payer: Self-pay

## 2020-01-26 DIAGNOSIS — F4323 Adjustment disorder with mixed anxiety and depressed mood: Secondary | ICD-10-CM

## 2020-01-26 DIAGNOSIS — F411 Generalized anxiety disorder: Secondary | ICD-10-CM

## 2020-01-26 MED ORDER — BUPROPION HCL ER (XL) 150 MG PO TB24: 150.0000 mg | ORAL_TABLET | Freq: Every day | ORAL | 0 refills | Status: DC

## 2020-02-04 ENCOUNTER — Other Ambulatory Visit: Payer: Self-pay | Admitting: Psychiatry

## 2020-02-04 DIAGNOSIS — F410 Panic disorder [episodic paroxysmal anxiety] without agoraphobia: Secondary | ICD-10-CM

## 2020-02-04 DIAGNOSIS — F411 Generalized anxiety disorder: Secondary | ICD-10-CM

## 2020-02-04 NOTE — Telephone Encounter (Signed)
Last apt 07/2019 

## 2020-02-06 ENCOUNTER — Telehealth: Payer: Self-pay | Admitting: Psychiatry

## 2020-02-06 DIAGNOSIS — G2581 Restless legs syndrome: Secondary | ICD-10-CM

## 2020-02-06 DIAGNOSIS — F410 Panic disorder [episodic paroxysmal anxiety] without agoraphobia: Secondary | ICD-10-CM

## 2020-02-06 MED ORDER — GABAPENTIN 100 MG PO CAPS: 200.0000 mg | ORAL_CAPSULE | Freq: Two times a day (BID) | ORAL | 0 refills | Status: DC

## 2020-02-06 NOTE — Telephone Encounter (Signed)
Mother phones after hours through [employer] twice initially taking patient did not give his Klonopin sent to the pharmacy 2 days ago and that in [facility] where he is playing baseball for the [employer] having flulike feelings as though he is sick consider withdrawal although likely prepanic.  Mother states he does not as he is back at college that she helps him get emergency medication but not attend to other responsibility.  Mother calls again that he did get the Klonopin but needs the Neurontin also.  Neurontin 100 mg sent as 2 twice daily as E scribed to [facility] as #120 with no refill note to pharmacy to find mother when ready for pickup as she requests, mother stating that he will set up and attend for mandated appointment.

## 2020-02-11 ENCOUNTER — Other Ambulatory Visit: Payer: Self-pay

## 2020-02-11 DIAGNOSIS — F410 Panic disorder [episodic paroxysmal anxiety] without agoraphobia: Secondary | ICD-10-CM

## 2020-02-11 DIAGNOSIS — G2581 Restless legs syndrome: Secondary | ICD-10-CM

## 2020-02-25 ENCOUNTER — Other Ambulatory Visit: Payer: Self-pay | Admitting: Psychiatry

## 2020-02-25 DIAGNOSIS — F411 Generalized anxiety disorder: Secondary | ICD-10-CM

## 2020-02-25 DIAGNOSIS — F4323 Adjustment disorder with mixed anxiety and depressed mood: Secondary | ICD-10-CM

## 2020-02-25 NOTE — Telephone Encounter (Signed)
Please review

## 2020-03-08 ENCOUNTER — Other Ambulatory Visit: Payer: Self-pay

## 2020-03-08 ENCOUNTER — Encounter: Payer: Self-pay | Admitting: Psychiatry

## 2020-03-08 ENCOUNTER — Ambulatory Visit (INDEPENDENT_AMBULATORY_CARE_PROVIDER_SITE_OTHER): Payer: Commercial Managed Care - PPO | Admitting: Psychiatry

## 2020-03-08 VITALS — Ht 68.0 in | Wt 221.0 lb

## 2020-03-08 DIAGNOSIS — F410 Panic disorder [episodic paroxysmal anxiety] without agoraphobia: Secondary | ICD-10-CM

## 2020-03-08 DIAGNOSIS — F341 Dysthymic disorder: Secondary | ICD-10-CM

## 2020-03-08 DIAGNOSIS — G2581 Restless legs syndrome: Secondary | ICD-10-CM

## 2020-03-08 DIAGNOSIS — F411 Generalized anxiety disorder: Secondary | ICD-10-CM

## 2020-03-08 HISTORY — DX: Dysthymic disorder: F34.1

## 2020-03-08 MED ORDER — CLONAZEPAM 0.5 MG PO TABS: 0.5000 mg | ORAL_TABLET | Freq: Three times a day (TID) | ORAL | 3 refills | Status: DC | PRN

## 2020-03-08 MED ORDER — ESCITALOPRAM OXALATE 20 MG PO TABS: 20.0000 mg | ORAL_TABLET | Freq: Every day | ORAL | 1 refills | Status: DC

## 2020-03-08 MED ORDER — BUPROPION HCL ER (XL) 150 MG PO TB24: 150.0000 mg | ORAL_TABLET | Freq: Every day | ORAL | 1 refills | Status: DC

## 2020-03-08 MED ORDER — GABAPENTIN 100 MG PO CAPS: 200.0000 mg | ORAL_CAPSULE | Freq: Two times a day (BID) | ORAL | 1 refills | Status: DC

## 2020-03-08 NOTE — Progress Notes (Signed)
[facility]  Patient ID: Lucas Sherman,  MRN: [id number]  PCP: [facility]  Date of Evaluation: 03/08/2020 Time spent:20 minutes from 0940 to 1000  Chief Complaint:  Chief Complaint    Anxiety; Panic Attack; Depression      HISTORY/CURRENT STATUS: Lucas Sherman is seen onsite in office 20 minutes face-to-face individually with consent with epic collateral for psychiatric interview and exam in 49-month evaluation and management being 5 months overdue for panic and generalized anxiety and persistent depression.  In the interim the patient has contacted the office in many ways including through his mother by phone when he feels he needs more medicine but continues to refuse to attend or set up his office care for such as he is also mandated with Klonopin.  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  He suggested nap also helped him feel better as did the 200 mg Neurontin dose.  Patient concludes that he as far as baseball is concerned is still freshman year as to college eligibility so that he could play 3 more years.  He seeks to transfer to a larger school from his current [employer] college.  He states he is anxious about not focusing well enough when he is pitching or in class.  He does well on the job from noon to 11 PM on 4 days weekly by his self-report and states he works out regularly. Still he feels too anxious to pitch the game so that he drinks a lot of coffee to focus which helps though also needing Klonopin.  His overall sleep is adequate now with medications, and therapy with Jarvis Morgan, [facility] currently is helping his mood and  executive function. [place] registry documents last fill 02/04/2020 of #20 emergency supply.  He does take his Lexapro, Wellbutrin, and Neurontin along with the Klonopin.  He has no mania, suicidality, psychosis or delirium.  Anxiety             Presents forfollow up visit. Onset wasmore than 5 years ago. The problem has beenwaxing and waning, now anxiety improving the last 3 months with let down dysphoria of last fall now improved.. Symptoms includedecreased concentration,depressed mood, low energy, mild anhedonia, restlessness, excessive worry,prepanic, and nervous/anxious behavior. Patient reports noinsomnia,no muscle tension, no compulsions, no chest pain, no hyperventilation, nopalpitations, no fatigue, no congestion, no stomach pain,no nausea, and no suicidal ideas. Symptoms occurmost days. The severity of symptoms isinterfering with daily activities, causing significant distress and moderate. The symptoms are aggravated byfamily issues, caffeine, medication, social activities and work stress. The quality of sleep isfair. Nighttime awakenings:several.   Individual Medical History/ Review of Systems: Changes? :Yes Weight is up 4 pounds in 10 months and height is up 1/2 inch in 10 months  Allergies: Reglan [metoclopramide]  Current Medications:  Current Outpatient Medications:  .  buPROPion (WELLBUTRIN XL) 150 MG 24 hr tablet, Take 1 tablet (150 mg total) by mouth daily after breakfast., Disp: 90 tablet, Rfl: 1 .  clonazePAM (KLONOPIN) 0.5 MG tablet, Take 1 tablet (0.5 mg total) by mouth 3 (three) times daily as needed for anxiety (Panic attack)., Disp: 90 tablet, Rfl: 3 .  escitalopram (LEXAPRO) 20  MG tablet, Take 1 tablet (20 mg total) by mouth daily., Disp: 90 tablet, Rfl: 1 .  gabapentin (NEURONTIN) 100 MG capsule, Take 2 capsules (200 mg total) by mouth 2 (two) times daily., Disp: 360 capsule, Rfl: 1  Medication Side Effects: anxiety, confusion and fatigue/weakness  Family  Medical/ Social History: Changes? No  MENTAL HEALTH EXAM:  Height 5\' 8"  (1.727 m), weight 221 lb (100.2 kg).Body mass index is 33.6 kg/m. Muscle strengths and tone 5/5, postural reflexes and gait 0/0, and AIMS = 0.  General Appearance: Casual, Fairly Groomed, Guarded, Meticulous and Obese  Eye Contact:  Good  Speech:  Clear and Coherent, Normal Rate and Talkative  Volume:  Normal  Mood:  Anxious, Dysphoric and Euthymic  Affect:  Congruent, Inappropriate, Restricted and Anxious and Labile  Thought Process:  Coherent, Goal Directed, Irrelevant and Descriptions of Associations: Tangential and Circumstantial  Orientation:  Full (Time, Place, and Person)  Thought Content: Logical, Ilusions, Obsessions, Rumination and Tangential   Suicidal Thoughts:  No  Homicidal Thoughts:  No  Memory:  Immediate;   Good Remote;   Good  Judgement:  Fair  Insight:  Fair  Psychomotor Activity:  Normal, Increased, Mannerisms and Restlessness  Concentration:  Concentration: Fair and Attention Span: Good  Recall:  Fair  Fund of Knowledge: Good  Language: Good  Assets:  Desire for Improvement Resilience Social Support Talents/Skills  ADL's:  Intact  Cognition: WNL  Prognosis:  Good    DIAGNOSES:    ICD-10-CM   1. Generalized anxiety disorder  F41.1 buPROPion (WELLBUTRIN XL) 150 MG 24 hr tablet    clonazePAM (KLONOPIN) 0.5 MG tablet    escitalopram (LEXAPRO) 20 MG tablet  2. 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 Restless legs syndrome  G25.81 gabapentin (NEURONTIN) 100 MG capsule    Receiving Psychotherapy: Yes Patient suggesting he attends therapy with , [facility] but seems to have few sessions due to college and baseball     RECOMMENDATIONS: Patient reviews content of anxiety for triggers and consequences whether generalized or panic anxiety.  He maintains an elaborate cognitive and behavioral repertoire of coping for various obstacles of anxiety into which he incorporates his medications and therapy.  However he does not yet become self-directed and handing the formation of anxiety as he is rather attempting to neutralize the anxiety.  With the degree of anxiety and consequences, it is not possible to clinically formulate whether or not attention deficit may be important to assess and treat as well.  He request to continue his current medications stating he is much improved over last fall and agrees to return earlier if not making continuous even if gradual progress.  He is E scribed Neurontin 100 mg capsule taking 2 capsules total 200 mg twice daily generalized and panic anxiety and restless leg syndrome as #360 capsules with [facility]ill [facility] [address].  He is E scribed Lexapro 20 mg every morning sent as #90 with 1 refill to [facility] [address] for anxieties and depression.  He is E scribed Wellbutrin 150 mg XL every morning as #90 with 1 refill sent to [facility] [address] for depression and anxiety.  He has E scribed Klonopin 0.5 mg up to  3 times daily as needed for panic or other anxiety #3 with 3 refills sent to [facility] for panic disorder, generalized anxiety, and RLS.  He returns for follow-up in 6 months or sooner if willing and needed.   Delight Hoh, MD

## 2020-03-29 ENCOUNTER — Telehealth: Payer: Self-pay | Admitting: Psychiatry

## 2020-03-29 NOTE — Telephone Encounter (Signed)
Patient already has refills on file at his pharmacy

## 2020-03-29 NOTE — Telephone Encounter (Signed)
Pt would like a refill on klonopin. [facility] [address].

## 2020-04-12 ENCOUNTER — Other Ambulatory Visit: Payer: Self-pay

## 2020-04-12 ENCOUNTER — Ambulatory Visit (INDEPENDENT_AMBULATORY_CARE_PROVIDER_SITE_OTHER): Payer: Commercial Managed Care - PPO | Admitting: Psychiatry

## 2020-04-12 ENCOUNTER — Encounter: Payer: Self-pay | Admitting: Psychiatry

## 2020-04-12 VITALS — Ht 68.0 in | Wt 218.0 lb

## 2020-04-12 DIAGNOSIS — F411 Generalized anxiety disorder: Secondary | ICD-10-CM

## 2020-04-12 DIAGNOSIS — F341 Dysthymic disorder: Secondary | ICD-10-CM | POA: Diagnosis not present

## 2020-04-12 DIAGNOSIS — F410 Panic disorder [episodic paroxysmal anxiety] without agoraphobia: Secondary | ICD-10-CM | POA: Diagnosis not present

## 2020-04-12 DIAGNOSIS — F902 Attention-deficit hyperactivity disorder, combined type: Secondary | ICD-10-CM

## 2020-04-12 HISTORY — DX: Attention-deficit hyperactivity disorder, combined type: F90.2

## 2020-04-12 MED ORDER — LISDEXAMFETAMINE DIMESYLATE 50 MG PO CAPS: 50.0000 mg | ORAL_CAPSULE | Freq: Every day | ORAL | 0 refills | Status: DC

## 2020-04-12 NOTE — Progress Notes (Signed)
[facility]  Patient ID: [name],  MRN: [id number]  PCP: Lucas Sherman  Date of Evaluation: 04/12/2020 Time spent:25 minutes from 1345 to 1410  Chief Complaint:  Chief Complaint    ADHD; Anxiety; Panic Attack; Depression      HISTORY/CURRENT STATUS: Lucas Sherman is seen onsite in office 25 minutes conjointly with mother face-to-face with consent with epic collateral on acute follow-up referral of therapist Lucas Sherman for psychiatric interview and exam in 9-month evaluation and management of ADHD clarified in the course of ongoing therapy including but research into past records academic and Sherman comorbidities with previously treated generalized and panic anxiety and atypical dysthymia.  Patient's defenses undermine problem identification and solving with mother being ambivalent about these.  She reinforces the patient's defensiveness while trying to cope with the consequences for herself, patient, and others.  Patient talks rapidly as usual in overcontrolling fashion as he emphasizes that therapy has revealed his stimulation seeking which reorganizes his focus now known to be chronic in the service of maintaining attention and concentration as much as for relief of anxiety, more evident as anxiety and depression are reduced in the course of treatment.  They are not more specific about what records Lucas Sherman  researched having  found references to longstanding ADHD symptoms into early childhood, which patient denied in initial assessment here any history for ADHD or bipolar diathesis.  However with less defensiveness for less anxiety in the session today, all can clarify longstanding inattention, impulsivity, inconsistency, and hyperactivity.  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  He now plans to transfer from the 2 year [employer] [facility] to [employer] where he will play baseball and complete his associates degree hoping from there for university and possibly professional baseball, he seems to see as a baseball opportunity but likely an academic stepdown.  We review with mother the absence of other childhood contributing factors and the need for treatment for ADHD currently.  Patient has no mania, suicidality, psychosis or delirium.  Anxiety Presents forfollow up visit. Onset wasmore than 5 years ago. The problem has beenwaxing and waning,nowanxietyimproving the last 4 monthswith let down dysphoria of last fall now improved.. Symptoms includedecreased concentration,impulsivity, inconsistency, motoric hyperactivity, reactive sadness,  restlessness,excessive worry,prepanic, andnervous/anxious behavior. Patient reports noinsomnia,nomuscle tension,nocompulsions,nochest pain,nohyperventilation, nopalpitations, nofatigue, nocongestion,no  low energy, no anhedonia, nostomach pain,nonausea, and nosuicidal ideas. Symptoms occurmost days. The severity of symptoms isinterfering with daily activities, causing significant distress and moderate. The symptoms are aggravated byfamily issues, caffeine, medication, social activities and work stress. The quality of sleep isfair. Nighttime awakenings:several.  Individual Sherman History/ Review of Systems: Changes? :Yes weight is down 3 pounds in the last month but up 1 pound from 11 months ago  Allergies: Reglan [metoclopramide]  Current Medications:  Current Outpatient Medications:  .  clonazePAM (KLONOPIN) 0.5 MG tablet, Take 1 tablet (0.5 mg total) by mouth 3 (three) times daily as needed for anxiety (Panic attack)., Disp: 90 tablet, Rfl: 3 .  escitalopram (LEXAPRO) 20 MG tablet, Take 1 tablet (20 mg total) by mouth daily., Disp: 90 tablet, Rfl: 1 .  gabapentin (NEURONTIN) 100 MG capsule, Take  2 capsules (200 mg total) by mouth 2 (two) times daily., Disp: 360 capsule, Rfl: 1 .  lisdexamfetamine (VYVANSE) 50 MG capsule, Take 1 capsule (50 mg total) by  mouth daily after breakfast., Disp: 30 capsule, Rfl: 0  Medication Side Effects: none  Family Sherman/ Social History: Changes? No  MENTAL HEALTH EXAM:  Height 5\' 8"  (1.727 m), weight 218 lb (98.9 kg).Body mass index is 33.15 kg/m. Muscle strengths and tone 5/5, postural reflexes and gait 0/0, and AIMS = 0.  General Appearance: Casual, Fairly Groomed, Guarded, Meticulous and Obese  Eye Contact:  Fair  Speech:  Clear and Coherent, Normal Rate and Talkative  Volume:  Normal  Mood:  Anxious, Dysphoric and Euthymic  Affect:  Congruent, Inappropriate, Labile and Anxious  Thought Process:  Coherent, Goal Directed, Irrelevant, Linear and Descriptions of Associations: Tangential and Circumstantial  Orientation:  Full (Time, Place, and Person)  Thought Content: Ilusions, Obsessions, Rumination and Tangential   Suicidal Thoughts:  No  Homicidal Thoughts:  No  Memory:  Immediate;   Good Remote;   Good  Judgement:  Fair  Insight:  Fair  Psychomotor Activity:  Normal, Increased, Mannerisms and Restlessness  Concentration:  Concentration: Fair and Attention Span: Poor  Recall:  Fair  Fund of Knowledge: Good  Language: Good  Assets:  Desire for Improvement Resilience Social Support Talents/Skills  ADL's:  Intact  Cognition: WNL  Prognosis:  Good    DIAGNOSES:    ICD-10-CM   1. Attention deficit hyperactivity disorder (ADHD), combined type, moderate  F90.2 lisdexamfetamine (VYVANSE) 50 MG capsule  2. Generalized anxiety disorder  F41.1   3. Panic disorder  F41.0   4. Persistent depressive disorder with atypical features, currently mild  F34.1     Receiving Psychotherapy: Yes  with , LP   RECOMMENDATIONS: With the associated atypical dysthymia and panic disorder, Vyvanse is much more appropriate than Adderall  for consistent treatment of ADHD.  He is E scribed Vyvanse 50 mg every morning after breakfast #30 with no refills sent to Lucas Sherman for ADHD.  He must continue anxiety and depression treatment as he and mother start with the Lexapro has been extremely helpful 20 mg every morning and current supply for generalized and panic anxiety and dysthymia.  He continues Neurontin 100 mg capsule taking 2 capsules twice daily for generalized and panic anxiety and restless leg symptoms.  He also takes Klonopin 0.5 mg up to 3 times daily as needed for panic or high degree performance anxiety having current supply particularly for panic and GAD.  He continues psychotherapy with [place], LPC, and we addressed the paradox of needing medications for relief of panic and high anxiety while stimulant for ADHD to  hopefully be resolved over time for him as he improves.  They understand prevention and monitoring and safety hygiene for medications and diagnoses especially automobile driving.  He returns for follow-up in 1 month or sooner if needed.  Lucas Morgan, MD

## 2020-04-12 NOTE — Progress Notes (Deleted)
[facility]  Patient ID: Lucas Sherman,  MRN: [id number]  PCP: [facility]  Date of Evaluation: 04/12/2020 Time spent:{TIME; 0 MIN TO 60 MIN:[phone]}  Chief Complaint:   HISTORY/CURRENT STATUS: HPI  Individual Medical History/ Review of Systems: Changes? :{EXAM; YES/NO:21197}  Allergies: Reglan [metoclopramide]  Current Medications:  Current Outpatient Medications:  .  buPROPion (WELLBUTRIN XL) 150 MG 24 hr tablet, Take 1 tablet (150 mg total) by mouth daily after breakfast., Disp: 90 tablet, Rfl: 1 .  clonazePAM (KLONOPIN) 0.5 MG tablet, Take 1 tablet (0.5 mg total) by mouth 3 (three) times daily as needed for anxiety (Panic attack)., Disp: 90 tablet, Rfl: 3 .  escitalopram (LEXAPRO) 20 MG tablet, Take 1 tablet (20 mg total) by mouth daily., Disp: 90 tablet, Rfl: 1 .  gabapentin (NEURONTIN) 100 MG capsule, Take 2 capsules (200 mg total) by mouth 2 (two) times daily., Disp: 360 capsule, Rfl: 1 Medication Side Effects: {Medication Side Effects (Optional):12147}  Family Medical/ Social History: Changes? {EXAM; YES/NO:19492::"No"}  MENTAL HEALTH EXAM:  Height 5\' 8"  (1.727 m), weight 218 lb (98.9 kg).Body mass index is 33.15 kg/m.  General Appearance: {[phone]}  Eye Contact:  {PSY:22684}  Speech:  {PSY:[phone]}  Volume:  {PSY:22686}  Mood:  {PSY:22306}  Affect:  {PSY:[id number]}  Thought Process:  {PSY:22688}  Orientation:  {PSY:22689}  Thought Content: {PSYt:22690}   Suicidal Thoughts:  {PSY:22692}  Homicidal Thoughts:  {PSY:22692}  Memory:  {PSY:[phone]}  Judgement:  {PSY:22694}  Insight:  {PSY:22695}  Psychomotor Activity:  {PSY:22696}  Concentration:  {PSY:21399}  Recall:  {PSY:22877}  Fund of Knowledge: {PSY:22877}  Language:  Assets:  {PSY:22698}  ADL's:  {PSY:22290}  Cognition: {PSY:304700322}  Prognosis:  {PSY:22877}    DIAGNOSES: No diagnosis found.  Receiving Psychotherapy:  {ZDG:64403}   RECOMMENDATIONS: ***   {KVQ:25956}, MD

## 2020-04-14 ENCOUNTER — Telehealth: Payer: Self-pay

## 2020-04-14 NOTE — Telephone Encounter (Signed)
Prior authorization submitted through cover my meds for VYVANSE 50 MG CAPSULES with [facility] ID# [id number], approved effective 04/14/2020-04/14/2021.  [facility] [address]

## 2020-05-09 ENCOUNTER — Telehealth: Payer: Self-pay | Admitting: Psychiatry

## 2020-05-09 NOTE — Telephone Encounter (Signed)
[facility] sends fax that they consider patient may have seizure disorder for which reason they advise against Wellbutrin and Klonopin though indicating to discard their message if he does not have seizures.  No seizures are known to have occurred.

## 2020-05-10 ENCOUNTER — Ambulatory Visit: Payer: Commercial Managed Care - PPO | Admitting: Psychiatry

## 2020-05-11 ENCOUNTER — Other Ambulatory Visit: Payer: Self-pay

## 2020-05-11 ENCOUNTER — Encounter: Payer: Self-pay | Admitting: Psychiatry

## 2020-05-11 ENCOUNTER — Ambulatory Visit (INDEPENDENT_AMBULATORY_CARE_PROVIDER_SITE_OTHER): Payer: Commercial Managed Care - PPO | Admitting: Psychiatry

## 2020-05-11 VITALS — Ht 68.0 in | Wt 207.0 lb

## 2020-05-11 DIAGNOSIS — F411 Generalized anxiety disorder: Secondary | ICD-10-CM

## 2020-05-11 DIAGNOSIS — F410 Panic disorder [episodic paroxysmal anxiety] without agoraphobia: Secondary | ICD-10-CM | POA: Diagnosis not present

## 2020-05-11 DIAGNOSIS — F902 Attention-deficit hyperactivity disorder, combined type: Secondary | ICD-10-CM

## 2020-05-11 DIAGNOSIS — F341 Dysthymic disorder: Secondary | ICD-10-CM | POA: Diagnosis not present

## 2020-05-11 DIAGNOSIS — G2581 Restless legs syndrome: Secondary | ICD-10-CM

## 2020-05-11 MED ORDER — LISDEXAMFETAMINE DIMESYLATE 50 MG PO CAPS: 50.0000 mg | ORAL_CAPSULE | Freq: Every day | ORAL | 0 refills | Status: DC

## 2020-05-11 MED ORDER — GABAPENTIN 100 MG PO CAPS: 200.0000 mg | ORAL_CAPSULE | Freq: Three times a day (TID) | ORAL | 2 refills | Status: DC

## 2020-05-11 MED ORDER — CLONAZEPAM 0.5 MG PO TABS: 0.5000 mg | ORAL_TABLET | Freq: Three times a day (TID) | ORAL | 1 refills | Status: DC | PRN

## 2020-05-11 NOTE — Progress Notes (Signed)
[facility]  Patient ID: Lucas Sherman,  MRN: [id number]  PCP: [facility]  Date of Evaluation: 05/11/2020 Time spent:25 minutes from 1555 to 1620  Chief Complaint:  Chief Complaint    ADHD; Anxiety; Panic Attack; Depression      HISTORY/CURRENT STATUS: Lucas Sherman is seen onsite in office 25 minutes face-to-face individually with consent with epic collateral for psychiatric interview and exam in 4-week evaluation and management of new from therapy diagnosis of ADHD concluded as well in the remote past, panic and generalized anxiety, atypical dysthymia, and restless legs syndrome.  The patient was seen with mother at last session attempting to integrate history from mother's recall of his childhood with current consequences especially in his college and sports life.  Patient concludes that he has new opportunities transferring to [employer] where he will play baseball and plan for classes as 2 online and 2 onsite.  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  He remains overactively anxious without definite manic symptoms.  He describes conservative care with his medications but per [place] registry he has using all of his escribed Klonopin when he suggests taking it sparingly same as he discusses for Vyvanse.  Still, his treatment has been difficult to monitor including his gabapentin as he states he is out of his supply but had not filled certain supplies when at his prior college when disorganized and significantly inattentive. He intends to reorganize and consolidate assessed needs including in weight reduction but does not want to drop below 200 pounds because of his baseball.  As appetite diminished from Vyvanse during the day, he has been very hungry late evening.  His focus and problem-solving improve with stimulant though he feels anxious when it wears off rather than when  he takes it.  Vyvanse may also interrupt sleep onset though as long as he takes it early there is no problem.  He estimates 3 Klonopin doses weekly but [place] registry documents Vyvanse dispensing 7/13 and last Klonopin 05/05/2020, getting the Klonopin on a monthly basis.  He continues Lexapro and states today he is also taking Wellbutrin at times though intending to discontinue.  He notes that family sees improvement particularly mother being more social and less anxious.  Still he is somewhat controlling talking in dominating fashion to others while anxiously looking down.  He has no mania, suicidality, psychosis or delirium.  Anxiety Presents forfollow upvisit. Onset wasmore than 5 years ago. The problem has beenwaxing and waning,nowanxiety more treatable the last 4 monthswith let down dysphoriaof last fall now improved. Symptoms includedecreased concentration,impulsivity, inconsistency, motoric hyperactivity, reactive sadness, restlessness,excessive worry,prepanic,andnervous/anxious behavior. Patient reports noinsomnia,nomuscle tension,nocompulsions,nochest pain,nohyperventilation, nopalpitations, nofatigue, nocongestion,no  low energy, no anhedonia, nostomach pain,nonausea, and nosuicidal ideas. Symptoms occurmost days. The severity of symptoms isinterfering with daily activities, causing significant distress and moderate. The symptoms are aggravated byfamily issues, caffeine, medication, social activities and work stress. The quality of sleep isfair. Nighttime awakenings:several.  Individual Medical History/ Review of Systems: Changes? :Yes Last health counseling E visit 11/24/2019 with [facility] and staff.  Weight is down11 pounds in the last month and 14 pounds in the last 2 months though he reports not wanting weight below 200 pounds as that is needed for baseball.  Allergies: Reglan [metoclopramide]  Current Medications:  Current  Outpatient Medications:  .  clonazePAM (KLONOPIN) 0.5 MG tablet, Take 1 tablet (0.5 mg  total) by mouth 3 (three) times daily as needed for anxiety (Panic attack)., Disp: 90 tablet, Rfl: 1 .  escitalopram (LEXAPRO) 20 MG tablet, Take 1 tablet (20 mg total) by mouth daily., Disp: 90 tablet, Rfl: 1 .  gabapentin (NEURONTIN) 100 MG capsule, Take 2 capsules (200 mg total) by mouth 3 (three) times daily., Disp: 180 capsule, Rfl: 2 .  [START ON 05/12/2020] lisdexamfetamine (VYVANSE) 50 MG capsule, Take 1 capsule (50 mg total) by mouth daily after breakfast., Disp: 30 capsule, Rfl: 0 .  [START ON 06/11/2020] lisdexamfetamine (VYVANSE) 50 MG capsule, Take 1 capsule (50 mg total) by mouth daily after breakfast., Disp: 30 capsule, Rfl: 0 .  [START ON 07/11/2020] lisdexamfetamine (VYVANSE) 50 MG capsule, Take 1 capsule (50 mg total) by mouth daily after breakfast., Disp: 30 capsule, Rfl: 0  Medication Side Effects: none  Family Medical/ Social History: Changes? No  MENTAL HEALTH EXAM:  Height 5\' 8"  (1.727 m), weight 207 lb (93.9 kg).Body mass index is 31.47 kg/m. Muscle strengths and tone 5/5, postural reflexes and gait 0/0, and AIMS = 0.  General Appearance: Casual, Fairly Groomed, Guarded, Meticulous and Obese  Eye Contact:  Good  Speech:  Clear and Coherent, Normal Rate and Talkative  Volume:  Normal  Mood:  Anxious, Dysphoric and Euthymic  Affect:  Congruent, Inappropriate, Labile and Anxious  Thought Process:  Coherent, Goal Directed, Irrelevant, Linear and Descriptions of Associations: Tangential  Orientation:  Full (Time, Place, and Person)  Thought Content: Logical, Ilusions, Rumination and Tangential   Suicidal Thoughts:  No  Homicidal Thoughts:  No  Memory:  Immediate;   Good Remote;   Good  Judgement:  Fair  Insight:  Fair and Lacking  Psychomotor Activity:  Normal, Increased, Mannerisms and Restlessness  Concentration:  Concentration: Fair and Attention Span: Fair  Recall:   of Knowledge: Good  Language: Good  Assets:  Desire for Improvement Leisure Time Resilience Talents/Skills  ADL's:  Intact  Cognition: WNL  Prognosis:  Good    DIAGNOSES:    ICD-10-CM   1. Attention deficit hyperactivity disorder (ADHD), combined type, moderate  F90.2 lisdexamfetamine (VYVANSE) 50 MG capsule    lisdexamfetamine (VYVANSE) 50 MG capsule    lisdexamfetamine (VYVANSE) 50 MG capsule  2. Generalized anxiety disorder  F41.1 clonazePAM (KLONOPIN) 0.5 MG tablet  3. Panic disorder  F41.0 gabapentin (NEURONTIN) 100 MG capsule    clonazePAM (KLONOPIN) 0.5 MG tablet  4. Persistent depressive disorder with atypical features, currently mild  F34.1   5. Restless legs syndrome  G25.81 gabapentin (NEURONTIN) 100 MG capsule    [facility]   RECOMMENDATIONS: Patient reports still taking the Wellbutrin though with Vyvanse established and working quite well, he can facilitate the continued use of the Vyvanse without side effects by stopping the Wellbutrin.  He refuses to stop it abruptly though such is medically acceptable, but he prefers to reduce to half tablet of the 150 mg XL every morning for 4 to 6 days then 1/4 tablet for 4 to 6 days then stop.  He has E scribed Vyvanse 50 mg capsule taking 1 every morning after breakfast sent as #30 each for August 12, September 11, and October 11 [facility] for [facility].  He continues gabapentin 100 mg increased to 2 tablets 3 times daily sent #180 with 2 refills to [facility] for generalized anxiety, panic disorder, and restless legs syndrome.  He was E scribed Lexapro 20  mg every morning times #90 with 1 refill 03/08/2020 therefore having a 20-month supply remaining for anxiety and depression.  He is E scribed Klonopin 0.5 mg 3 times daily as needed for high anxiety and panic of generalized anxiety and panic disorders sent to [facility] #90 with 1 refill.  Education is updated on prevention  and monitoring safety hygiene.  He returns in 3 months or sooner if needed though having skipped appointments for many months this year until resuming care in June.   Chauncey Mann, MD

## 2020-06-12 IMAGING — CR ABDOMEN - 1 VIEW
1 series · 1 of 1 positions shown · non-contrast
Comparison: None.

CLINICAL DATA: 18 y/o M; 3 days of abdominal pain. Last normal
bowel movement was 5 days ago.

EXAM:
ABDOMEN - 1 VIEW

[t abdomen supine]
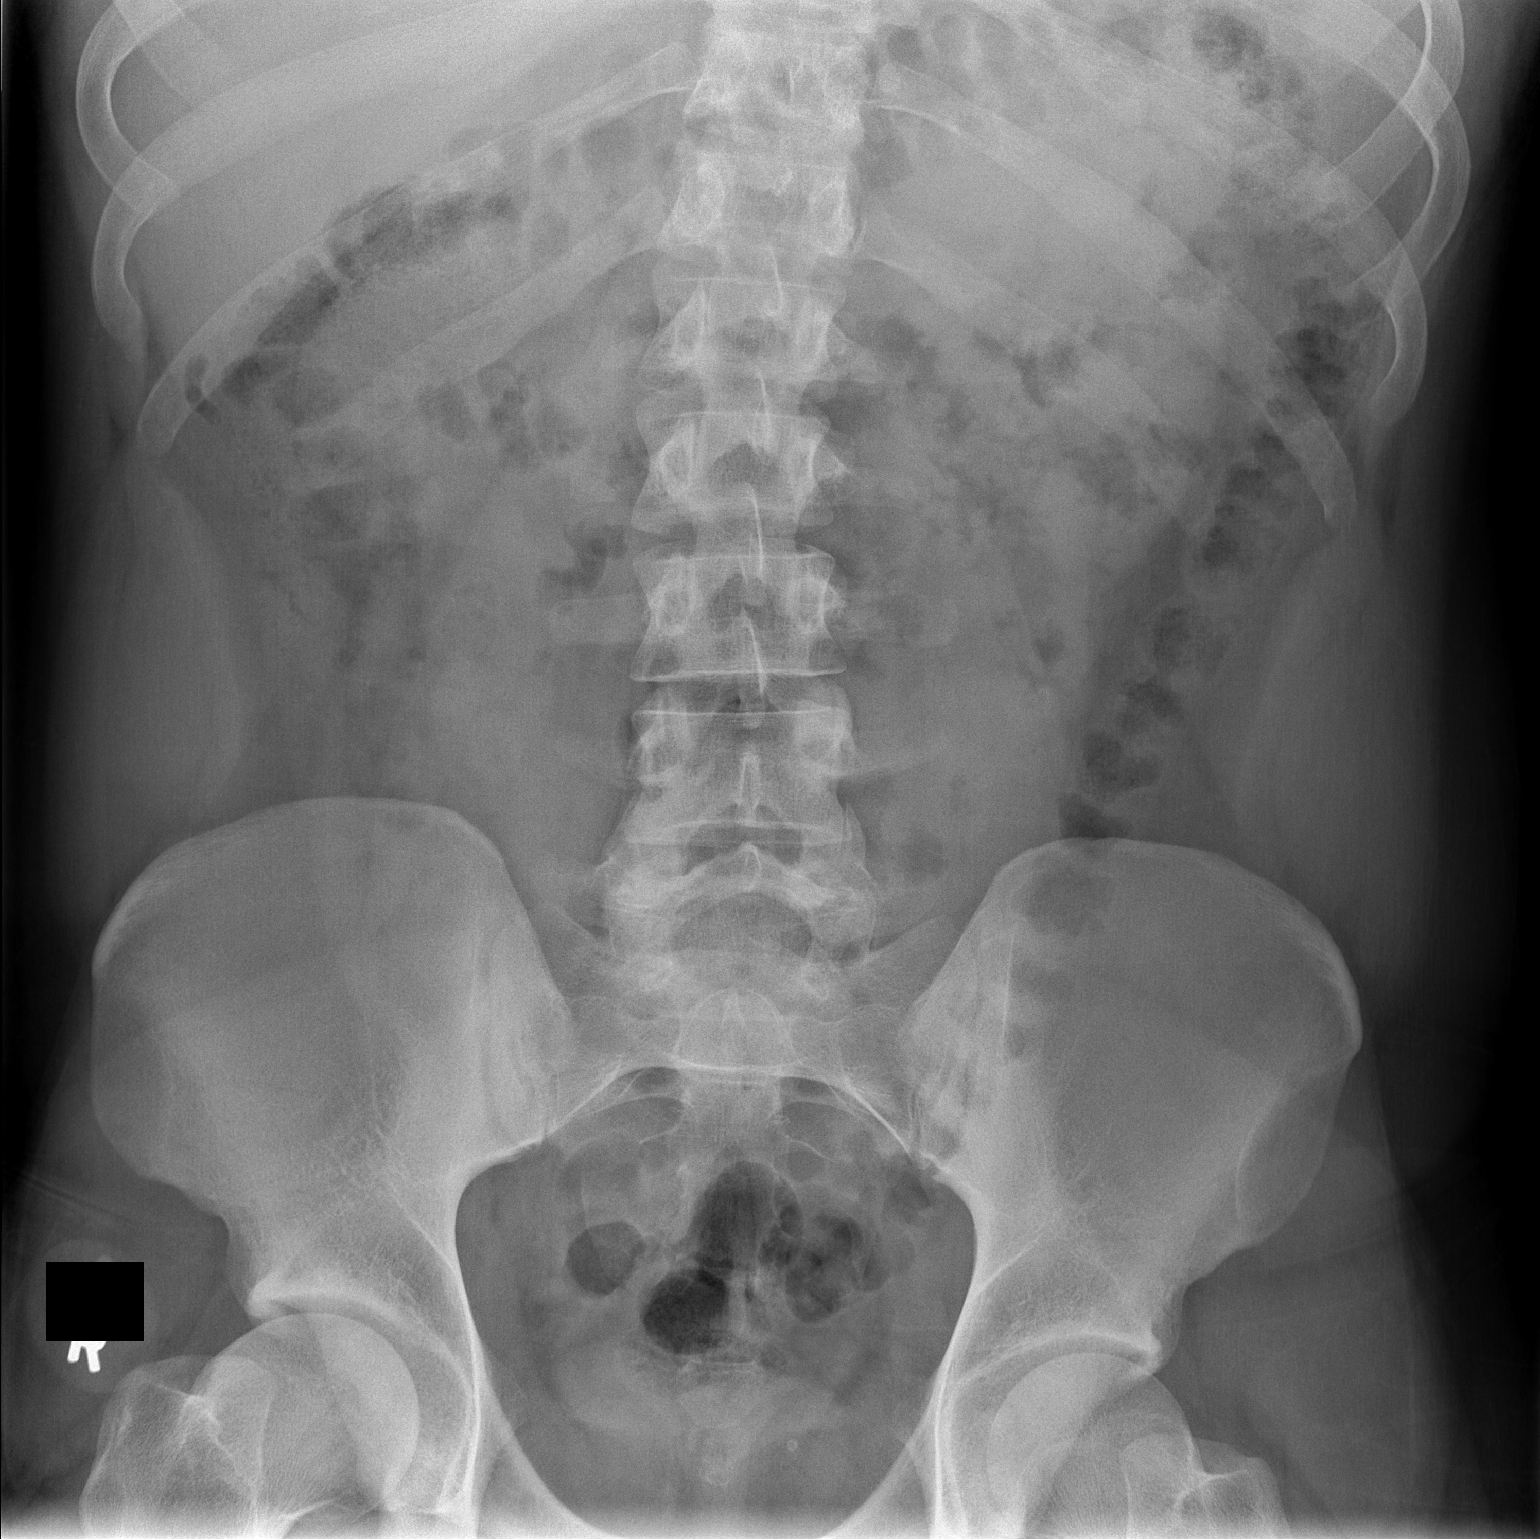

[1 of 1 positions shown; findings below may reference images not displayed]

FINDINGS: The bowel gas pattern is normal. No radio-opaque calculi or other
significant radiographic abnormality are seen. Mild dextrocurvature
of the lumbar spine.
IMPRESSION: Negative.

## 2020-06-14 IMAGING — US ULTRASOUND ABDOMEN LIMITED
1 series · 14 of 25 positions shown · non-contrast
Comparison: None.

CLINICAL DATA: Right upper quadrant pain

EXAM:
ULTRASOUND ABDOMEN LIMITED RIGHT UPPER QUADRANT

[Series 1: ultrasound abdomen limited · 14 of 51 slices shown]
[im 1/51]
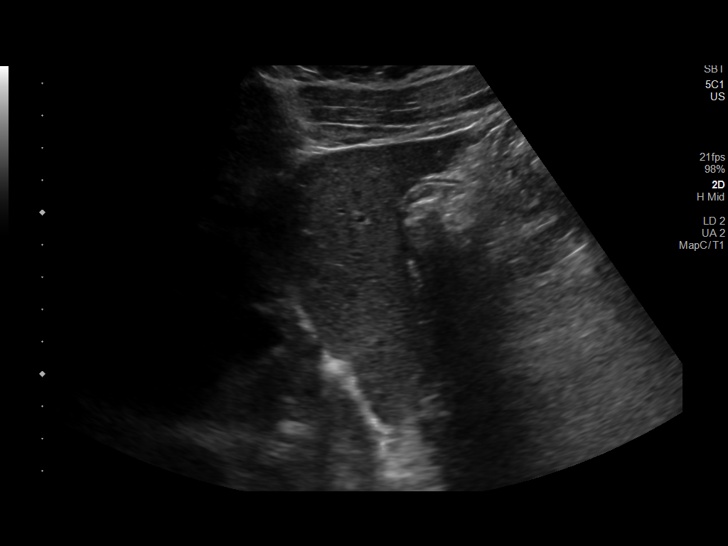
[im 5/51]
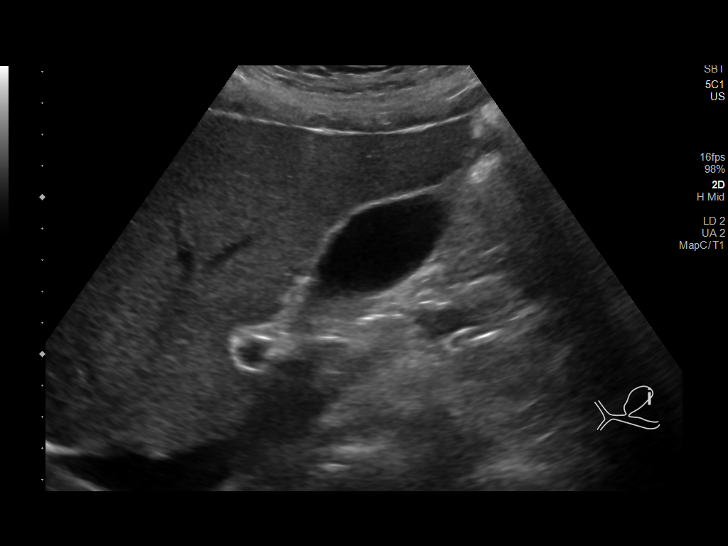
[im 9/51]
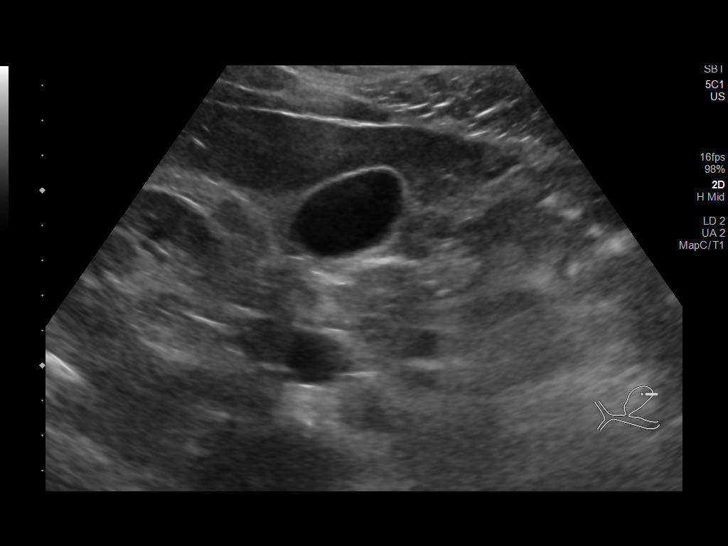
[im 13/51]
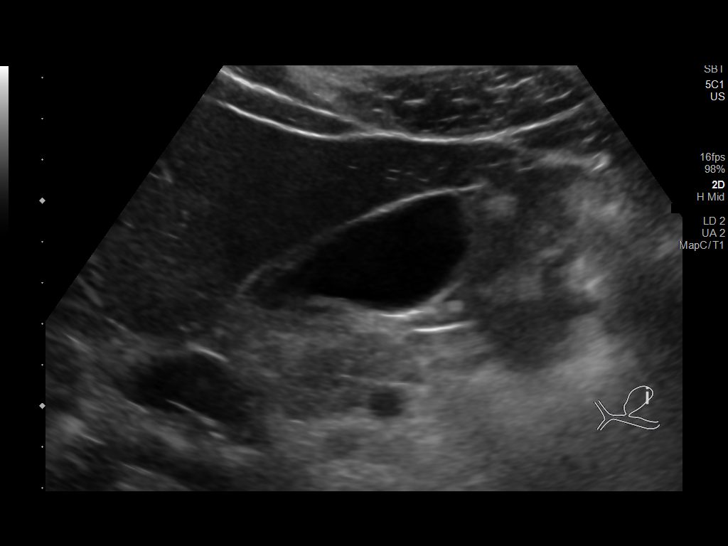
[im 17/51]
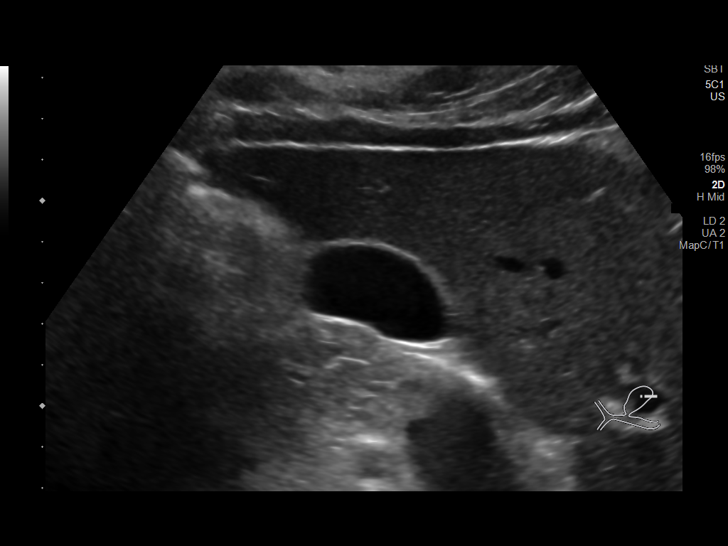
[im 19/51]
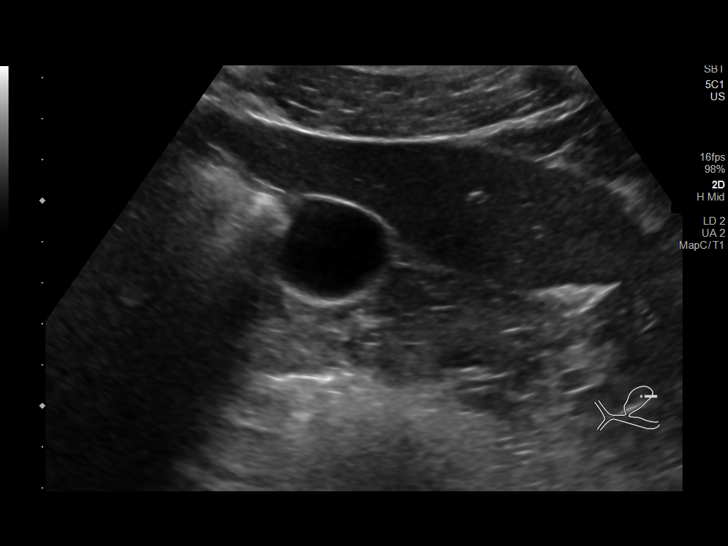
[im 23/51]
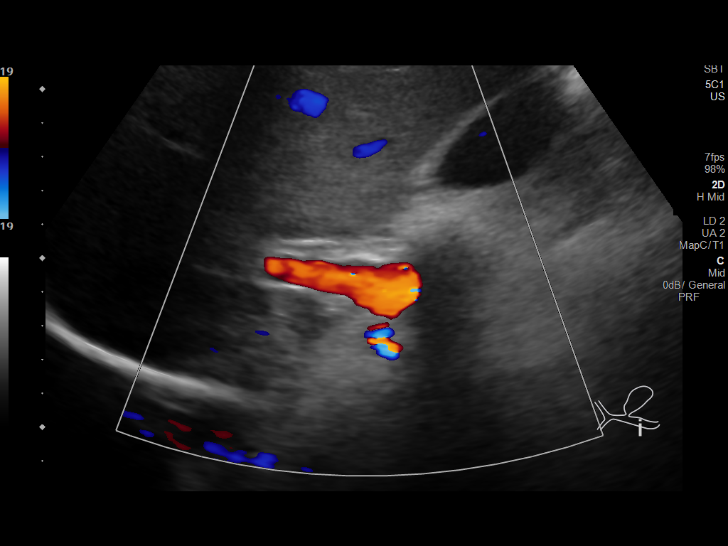
[im 28/51]
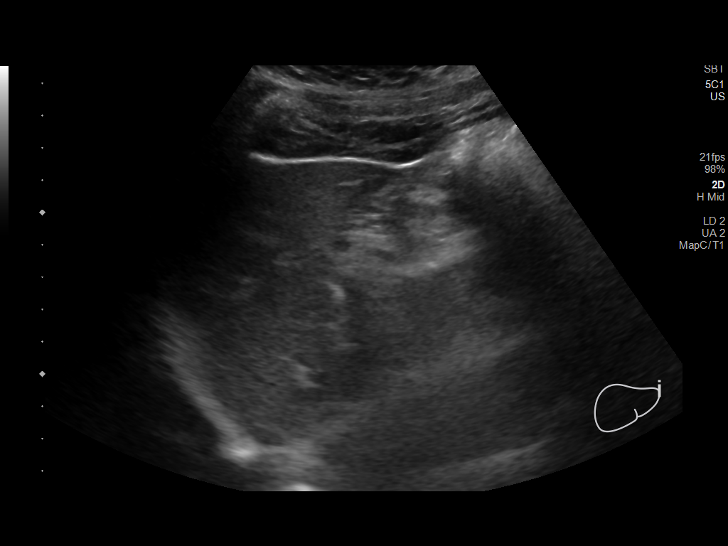
[im 32/51]
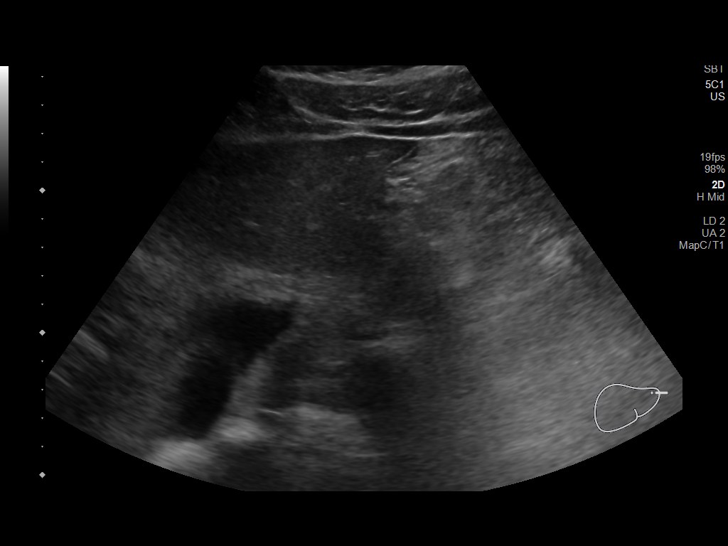
[im 34/51]
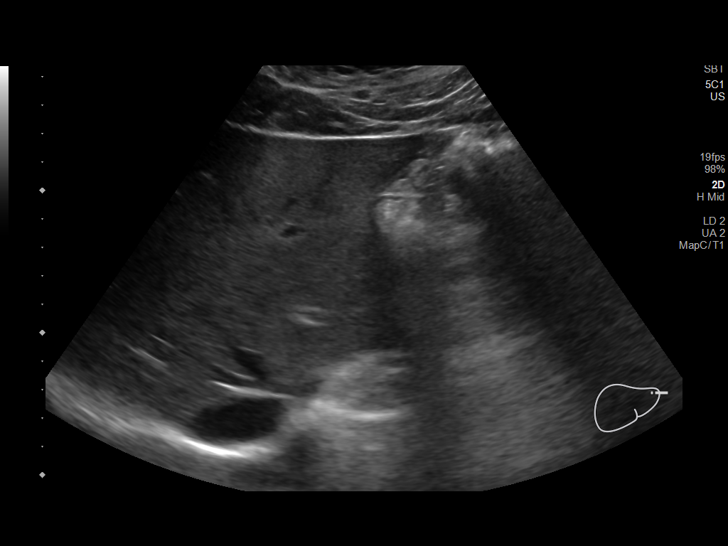
[im 38/51]
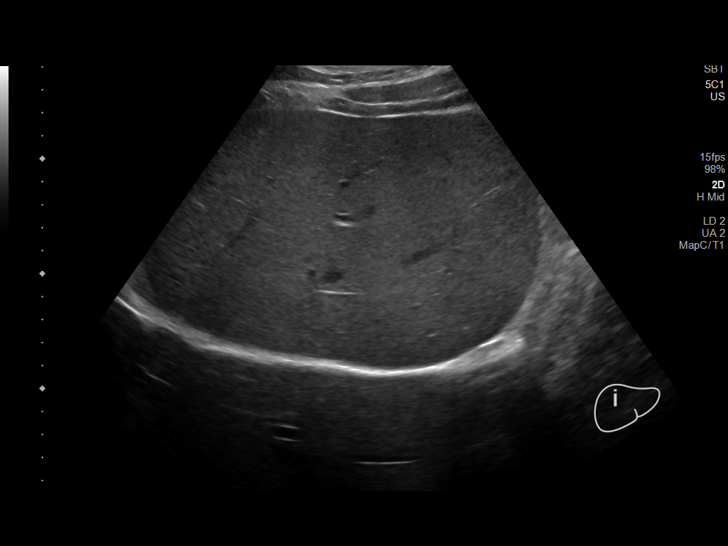
[im 42/51]
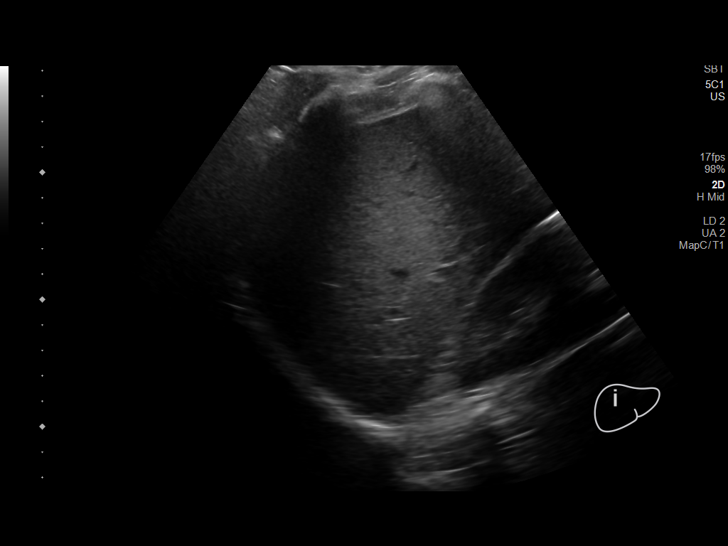
[im 46/51]
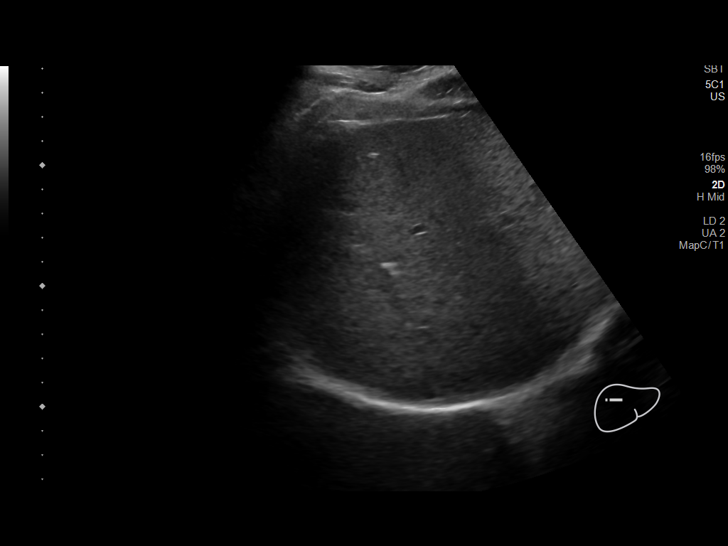
[im 51/51]
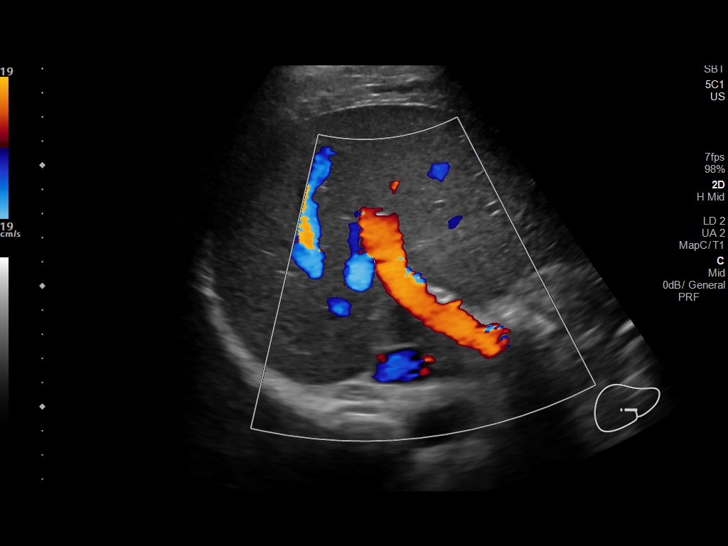

[14 of 25 positions shown; findings below may reference images not displayed]

FINDINGS: Gallbladder:

No gallstones or wall thickening visualized. There is no
pericholecystic fluid. No sonographic Murphy sign noted by
sonographer.

Common bile duct:

Diameter: 2 mm. No intrahepatic or extrahepatic biliary duct
dilatation.

Liver:

No focal lesion identified. Within normal limits in parenchymal
echogenicity. Portal vein is patent on color Doppler imaging with
normal direction of blood flow towards the liver.
IMPRESSION: Study within normal limits.

## 2020-07-12 ENCOUNTER — Other Ambulatory Visit: Payer: Self-pay | Admitting: Psychiatry

## 2020-07-12 DIAGNOSIS — F411 Generalized anxiety disorder: Secondary | ICD-10-CM

## 2020-07-12 DIAGNOSIS — F341 Dysthymic disorder: Secondary | ICD-10-CM

## 2020-07-12 DIAGNOSIS — F410 Panic disorder [episodic paroxysmal anxiety] without agoraphobia: Secondary | ICD-10-CM

## 2020-07-13 ENCOUNTER — Telehealth: Payer: Self-pay | Admitting: Psychiatry

## 2020-07-13 NOTE — Telephone Encounter (Signed)
Pt requesting RF of Klonopin to [facility],  Next app 11/8

## 2020-07-14 ENCOUNTER — Other Ambulatory Visit: Payer: Self-pay

## 2020-07-14 DIAGNOSIS — F410 Panic disorder [episodic paroxysmal anxiety] without agoraphobia: Secondary | ICD-10-CM

## 2020-07-14 DIAGNOSIS — F411 Generalized anxiety disorder: Secondary | ICD-10-CM

## 2020-07-14 NOTE — Telephone Encounter (Signed)
Patient should already have Rx on file from 05/11/20 that has not been filled but pended new Rx for Dr. Marlyne Beards to review and send if appropriate.

## 2020-07-14 NOTE — Telephone Encounter (Signed)
Pharmacy confirms a refill available on 9/3/20201 from prior script and the 2 fills sent 05/11/2020 so they will get one ready for him to pick up.

## 2020-07-15 IMAGING — CR CHEST - 2 VIEW
2 series · 2 of 2 positions shown · non-contrast
Comparison: None.

CLINICAL DATA: Chest pain.

EXAM:
CHEST - 2 VIEW

[w chest pa]
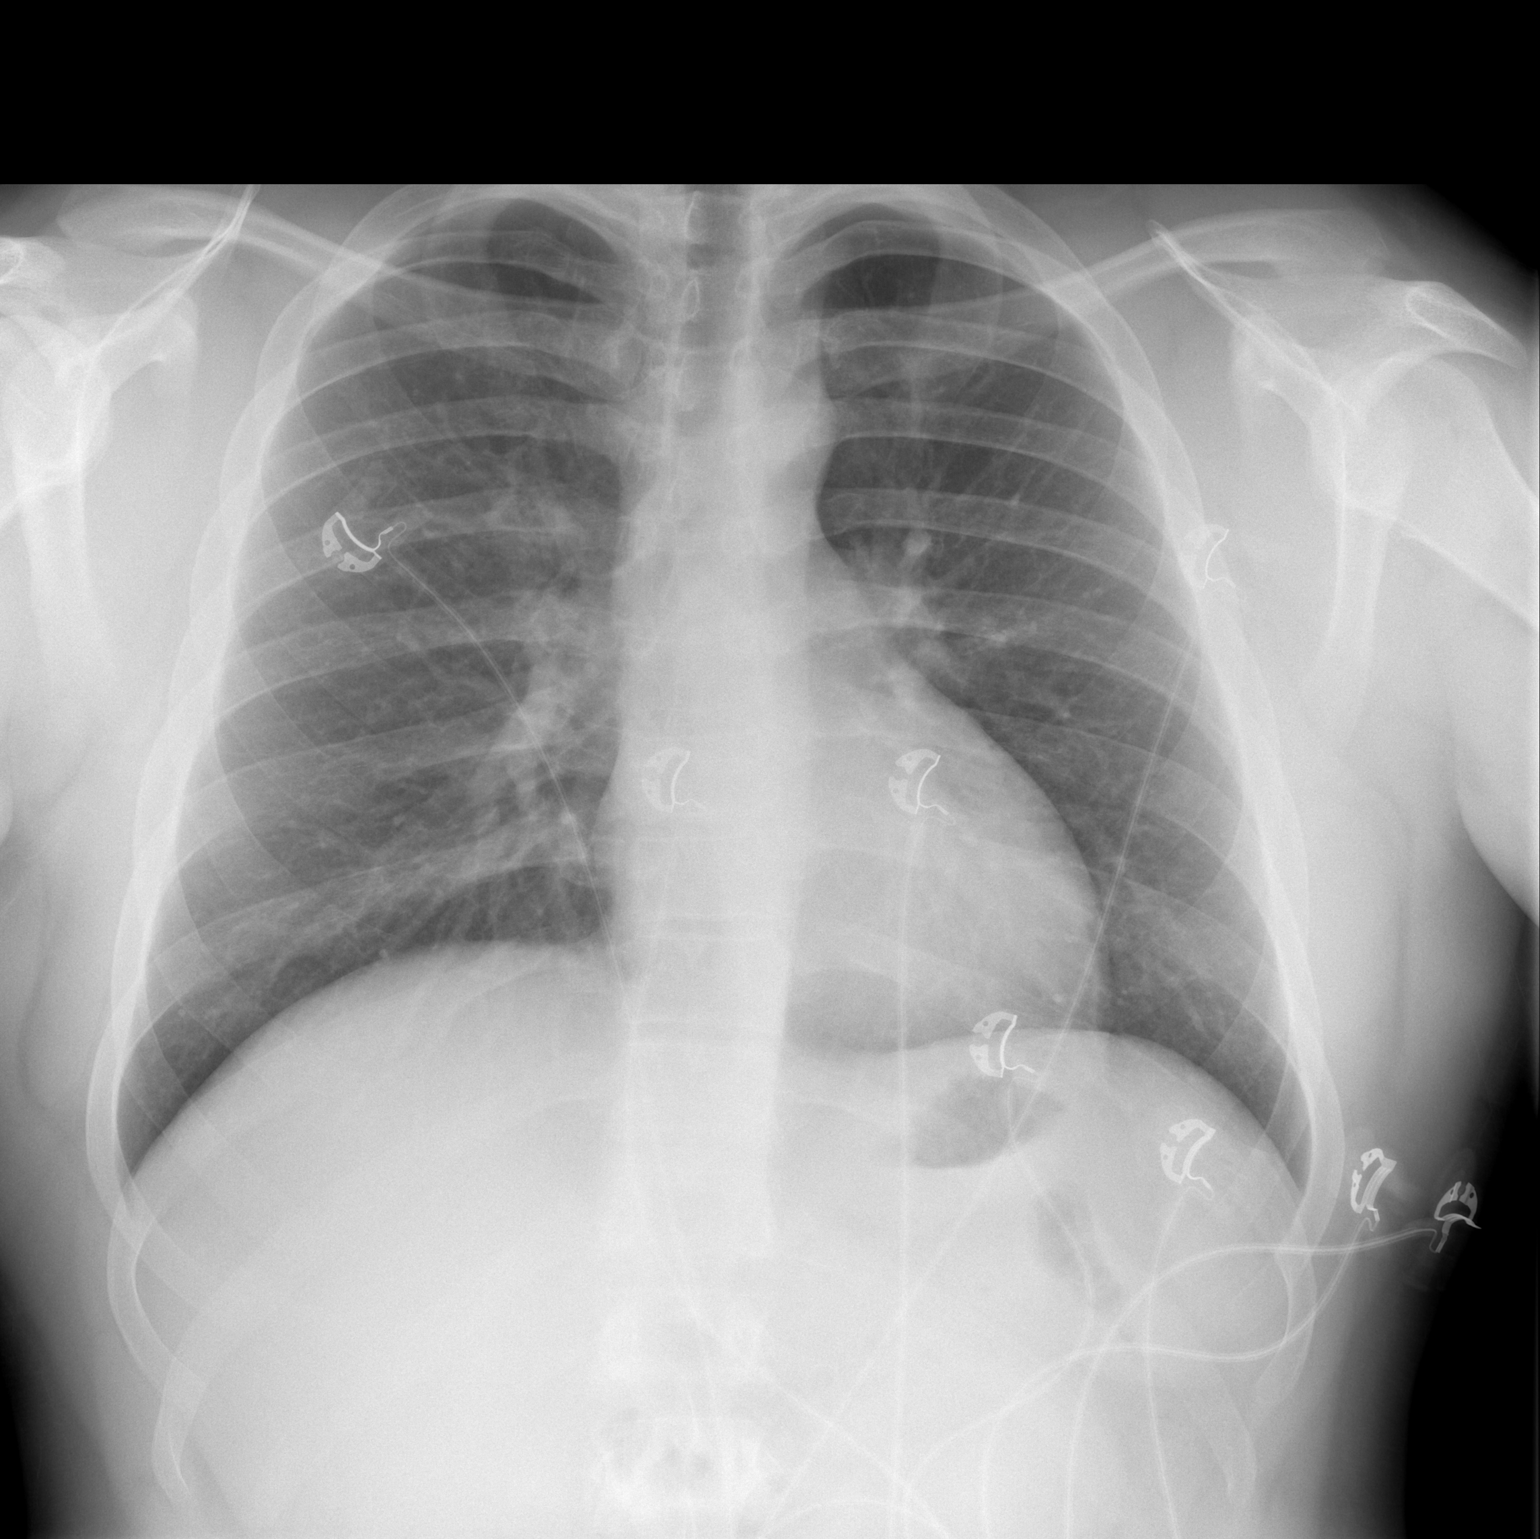

[w chest lat]
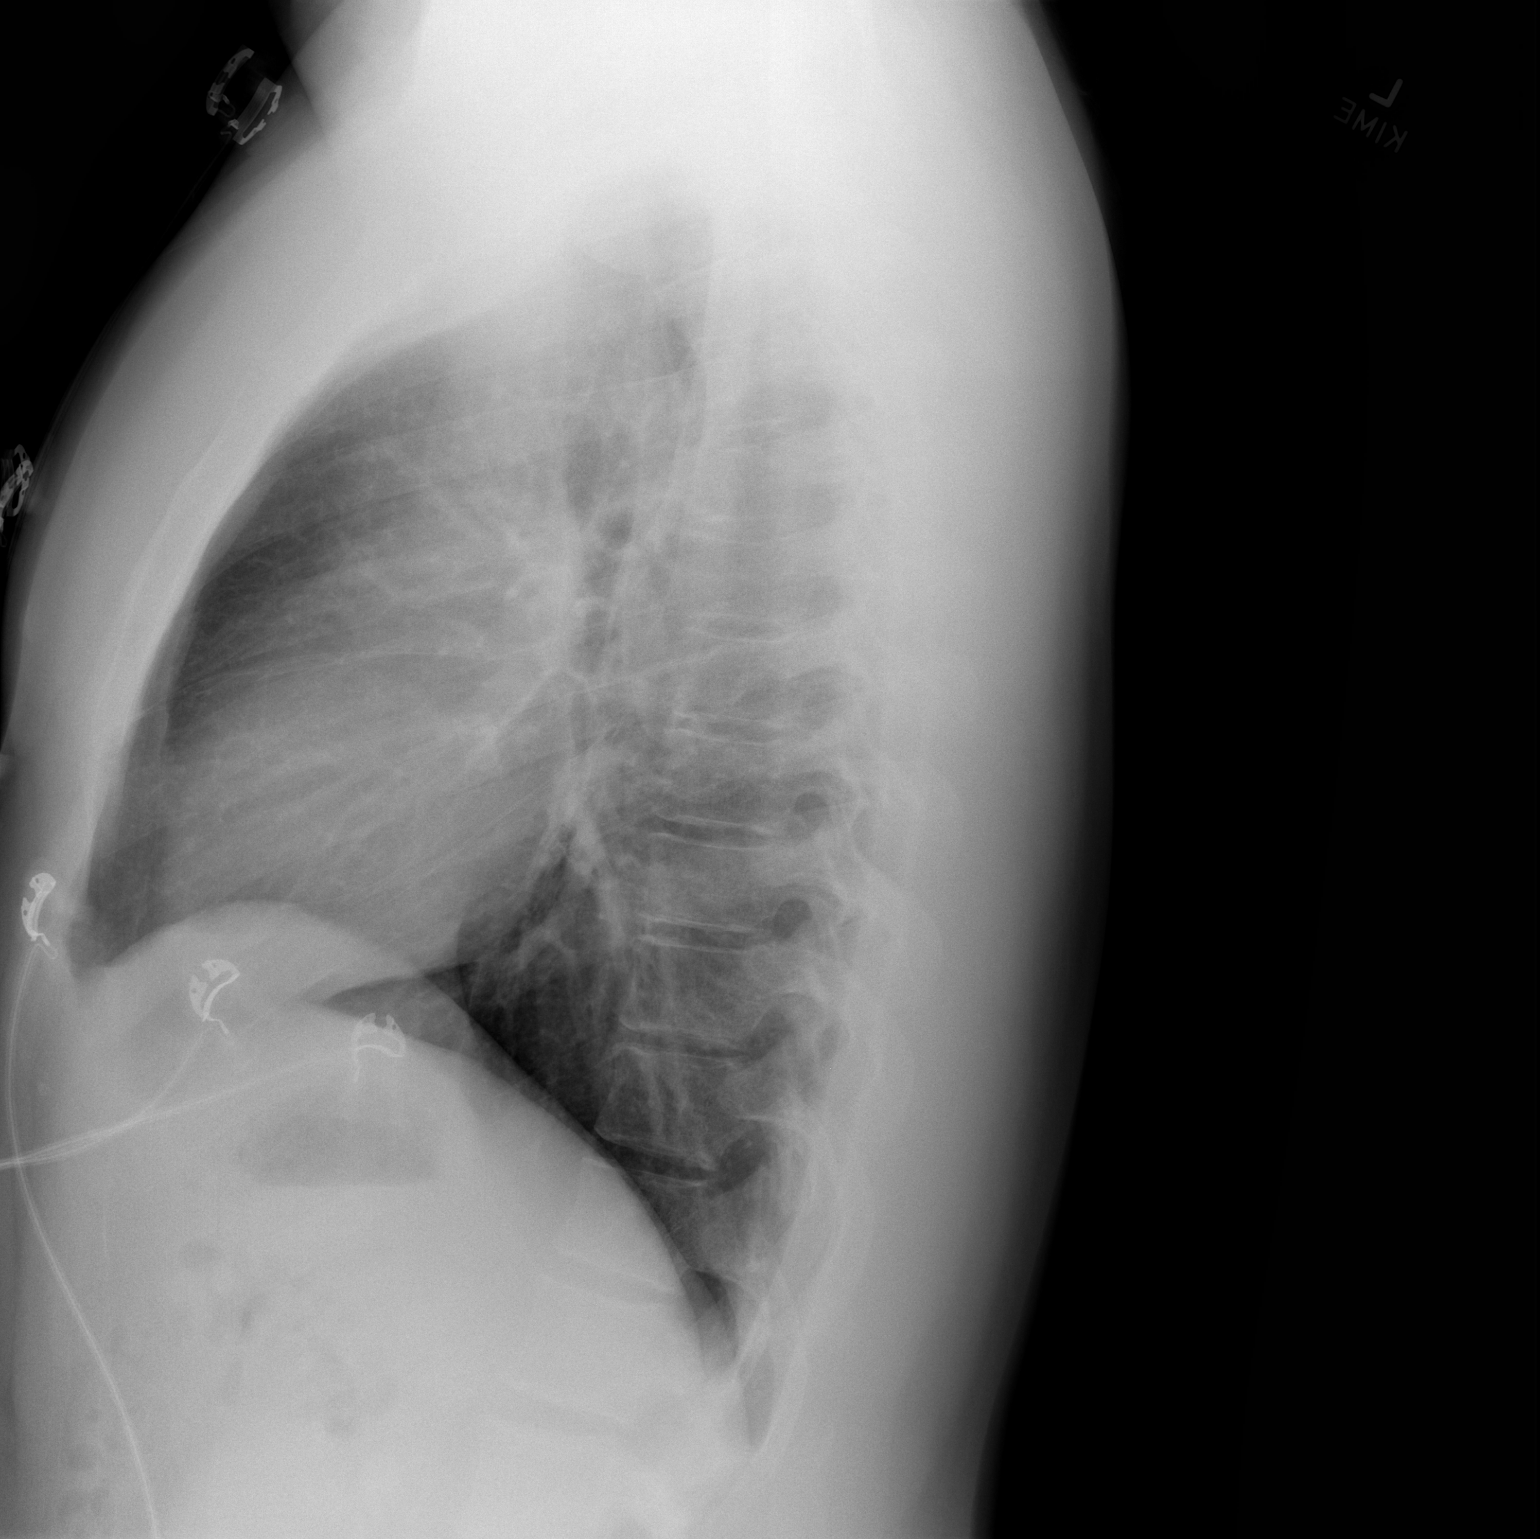

[2 of 2 positions shown; findings below may reference images not displayed]

FINDINGS: The heart size and mediastinal contours are within normal limits.
Both lungs are clear. The visualized skeletal structures are
unremarkable.
IMPRESSION: No active cardiopulmonary disease.

## 2020-07-20 ENCOUNTER — Encounter: Payer: Self-pay | Admitting: Psychiatry

## 2020-08-08 ENCOUNTER — Encounter: Payer: Self-pay | Admitting: Psychiatry

## 2020-08-08 ENCOUNTER — Ambulatory Visit (INDEPENDENT_AMBULATORY_CARE_PROVIDER_SITE_OTHER): Payer: Commercial Managed Care - PPO | Admitting: Psychiatry

## 2020-08-08 ENCOUNTER — Other Ambulatory Visit: Payer: Self-pay

## 2020-08-08 VITALS — Ht 68.0 in | Wt 205.0 lb

## 2020-08-08 DIAGNOSIS — F902 Attention-deficit hyperactivity disorder, combined type: Secondary | ICD-10-CM | POA: Diagnosis not present

## 2020-08-08 DIAGNOSIS — F341 Dysthymic disorder: Secondary | ICD-10-CM | POA: Diagnosis not present

## 2020-08-08 DIAGNOSIS — F411 Generalized anxiety disorder: Secondary | ICD-10-CM

## 2020-08-08 DIAGNOSIS — F410 Panic disorder [episodic paroxysmal anxiety] without agoraphobia: Secondary | ICD-10-CM | POA: Diagnosis not present

## 2020-08-08 DIAGNOSIS — G2581 Restless legs syndrome: Secondary | ICD-10-CM

## 2020-08-08 MED ORDER — GABAPENTIN 100 MG PO CAPS: 200.0000 mg | ORAL_CAPSULE | Freq: Two times a day (BID) | ORAL | 3 refills | Status: DC

## 2020-08-08 MED ORDER — CLONAZEPAM 0.5 MG PO TABS: 0.5000 mg | ORAL_TABLET | Freq: Three times a day (TID) | ORAL | 2 refills | Status: DC | PRN

## 2020-08-08 MED ORDER — ESCITALOPRAM OXALATE 20 MG PO TABS: 20.0000 mg | ORAL_TABLET | Freq: Every day | ORAL | 1 refills | Status: DC

## 2020-08-08 MED ORDER — LISDEXAMFETAMINE DIMESYLATE 50 MG PO CAPS: 50.0000 mg | ORAL_CAPSULE | Freq: Every day | ORAL | 0 refills | Status: DC

## 2020-08-08 NOTE — Progress Notes (Signed)
[facility]  Patient ID: Lucas Sherman,  MRN: [id number]  PCP: [facility]  Date of Evaluation: 08/08/2020 Time spent:20 minutes from 0940 to 1000  Chief Complaint:  Chief Complaint    ADHD; Anxiety; Panic Attack; Depression      HISTORY/CURRENT STATUS: Lucas Sherman is seen onsite in office 20 minutes face-to-face individually with consent with epic collateral for psychiatric interview and exam in 51-month evaluation management of panic and generalized anxiety, RLS, atypical dysthymia, and ADHD.  The course of clinical care has unfolded diagnoses in 8 sessions over 15 months as ADHD and dysthymia became more clearly present as generalized anxiety, panic, and RLS could be contained.  He had been treated for anxiety by primary care for 5 to 8 years prior to psychiatry referral with Lexapro during the last 8 years augmented recently with combined Pristiq unsuccessful so Klonopin was added prior to care here.  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  Doing so he has reduced his gabapentin started in his care here back to the 200 mg twice daily and the Klonopin at 0.5 mg 2 times daily with use ranging on the average from 1-1/2 tablets daily to sometimes none for several days.  Gabapentin helps his GI somatic anxiety present for the last for 5 years which for some time before starting care here included elevated ALT that has resolved.  He started Wellbutrin with modest benefit then he stopped that cold [place] after last visit having some sore throat and stuffy nose in the evening as what he consider discontinuation symptoms. He has subsequently successfully  established Vyvanse 50 mg daily.  He continues [employer] baseball to become more diligent in his academics and employment while maintaining baseball until he is established for possible [employer] transfer.  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  Weight is down 2 pounds. He does not feel depressed now on the Vyvanse having adequate mental energy level that is not depressing as he considered on Adderall.  He transferred therapy from Mathis Fare, LCSW to brother's therapist Jarvis Morgan, [facility].  He is not using cannabis but has started ashwagandha from work finding sleep improved but not much benefit from anxiety.  He has more hope and confidence for the future now approaching the past to be solved in a stepwise fashion with the help of therapist including pitching baseball no longer fearing decompensation anxiously when the count is 3 balls and no strikes.  We process my case closure for imminent retirement for patient's transfer to [facility], DNP in 3 months.  He has no mania, suicidality, psychosis or delirium.  Anxiety Presents forfollow upvisit. Onset wasmore than 8 years ago. The problem has beenwaxing and waning,nowanxiety more treatable the last51monthswith let down dysphoriaof last fall now improved. Symptoms includedecreased concentration,impulsivity, inconsistency, motoric hyperactivity,reactive sadness, rejection sensitivity, leaden fatigue, carbohydrate craving, restlessness,excessive worry,prepanic,andnervous/anxious behavior. Patient reports noinsomnia,no tension myalgia,nocompulsions,nochest pain,nohyperventilation, nopalpitations, nofatigue, nocongestion, noanhedonia, nostomach pain,nonausea, no mania, no obsessions, no delusions, and nosuicidal ideas. Symptoms occurmost days. The severity of symptoms isinterfering with daily activities, causing significant distress and moderate. The symptoms are aggravated  byfamily issues, caffeine, medication, social activities and work stress. The quality of sleep isfair. Nighttime awakenings:several.  Individual Medical  History/ Review of Systems: Changes? :Yes Weight is down 2 pounds in the last 3 months and 17 pounds in the last 15 months.  He has much less somatic testing and treatment.[place] notes last Klonopin dispensing 07/14/2020 and Vyvanse 07/13/2020.  Allergies: Reglan [metoclopramide]  Current Medications:  Current Outpatient Medications:  .  clonazePAM (KLONOPIN) 0.5 MG tablet, Take 1 tablet (0.5 mg total) by mouth 3 (three) times daily as needed for anxiety (Panic attack)., Disp: 90 tablet, Rfl: 2 .  escitalopram (LEXAPRO) 20 MG tablet, Take 1 tablet (20 mg total) by mouth daily., Disp: 90 tablet, Rfl: 1 .  gabapentin (NEURONTIN) 100 MG capsule, Take 2 capsules (200 mg total) by mouth 2 (two) times daily., Disp: 120 capsule, Rfl: 3 .  [START ON 08/12/2020] lisdexamfetamine (VYVANSE) 50 MG capsule, Take 1 capsule (50 mg total) by mouth daily after breakfast., Disp: 30 capsule, Rfl: 0 .  [START ON 09/11/2020] lisdexamfetamine (VYVANSE) 50 MG capsule, Take 1 capsule (50 mg total) by mouth daily after breakfast., Disp: 30 capsule, Rfl: 0 .  [START ON 10/11/2020] lisdexamfetamine (VYVANSE) 50 MG capsule, Take 1 capsule (50 mg total) by mouth daily after breakfast., Disp: 30 capsule, Rfl: 0  Medication Side Effects: none  Family Medical/ Social History: Changes? Yes mother has been treated with Paxil then Lexapro for anxiety that started postpartum raising question of associated depressive mood disorder.  Maternal grandmother took Remeron for depression and anxiety. Younger brother has taken Lexapro, Remeron, Pamelor and Paxil without success more recently Wellbutrin only tolerating 150 mg XL and Klonopin 0.5 mg twice daily.  There is some depression and alcohol use disorder on paternal side and some alcohol use disorder on the maternal side of the  family.  MENTAL HEALTH EXAM:  Height 5\' 8"  (1.727 m), weight 205 lb (93 kg).Body mass index is 31.17 kg/m. Muscle strengths and tone 5/5, postural reflexes and gait 0/0, and AIMS = 0.  General Appearance: Casual, Fairly Groomed, Meticulous and Obese  Eye Contact:  Good  Speech:  Clear and Coherent, Normal Rate and Talkative  Volume:  Normal  Mood:  Anxious, Dysphoric, Euthymic and Irritable  Affect:  Congruent, Inappropriate, Labile and Anxious  Thought Process:  Coherent, Goal Directed, Irrelevant, Linear and Descriptions of Associations: Tangential  Orientation:  Full (Time, Place, and Person)  Thought Content: Ilusions, Rumination and Tangential   Suicidal Thoughts:  No  Homicidal Thoughts:  No  Memory:  Immediate;   Good Remote;   Good  Judgement:  Fair  Insight:  Fair  Psychomotor Activity:  Normal, Increased and Mannerisms  Concentration:  Concentration: Fair and Attention Span: Fair  Recall:  of Knowledge: Good  Language: Good  Assets:  Desire for Improvement Leisure Time Resilience Talents/Skills  ADL's:  Intact  Cognition: WNL  Prognosis:  Good    DIAGNOSES:    ICD-10-CM   1. Attention deficit hyperactivity disorder (ADHD), combined type, moderate  F90.2 lisdexamfetamine (VYVANSE) 50 MG capsule    lisdexamfetamine (VYVANSE) 50 MG capsule    lisdexamfetamine (VYVANSE) 50 MG capsule  2. Generalized anxiety disorder  F41.1 escitalopram (LEXAPRO) 20 MG tablet    clonazePAM (KLONOPIN) 0.5 MG tablet  3. Panic disorder  F41.0 gabapentin (NEURONTIN) 100 MG capsule    escitalopram (LEXAPRO) 20 MG tablet    clonazePAM (KLONOPIN) 0.5 MG tablet  4. Persistent depressive disorder with atypical features, currently mild  F34.1 escitalopram (LEXAPRO) 20 MG tablet  5. Restless legs syndrome  G25.81 gabapentin (NEURONTIN)  100 MG capsule    Receiving Psychotherapy: Yes  withWilliam Melida Quitter, [facility]   RECOMMENDATIONS: Update is provided on prevention and monitoring  safety hygiene for medications and diagnoses at the current level of understanding.  Symptom treatment matching is integrated with ongoing psychotherapy he reviews to conclude to continue current medications containing any overuse or impulse disturbance.  Patient is willing and interested in this status of his treatment that can be applied as a template for success in academics, employment, and athletics.  He is E scribed Vyvanse 50 mg capsule every morning after breakfast sent as #30 each for November 12, December 12, and January 11 to [facility] for ADHD.  He is E scribed Lexapro 20 mg every morning sent as #90 with 1 refill to [facility] for generalized and panic anxiety and atypical dysthymia.  He is E scribed gabapentin reduced to 100 mg capsules taking 2 capsules total 200 mg twice daily sent as #120 with 3 refills to [facility] for generalized anxiety, panic disorder, and restless leg syndrome.  He is E scribed Klonopin 0.5 mg 3 times daily as needed for panic, high anxiety, or agitation sent as #90 with 2 refills to [facility] for generalized and panic anxiety and RLS.  Case closure for retirement will transition transfer to [facility], DNP for follow-up care in 3 months or sooner if needed.   Chauncey Mann, MD

## 2020-08-11 ENCOUNTER — Ambulatory Visit: Payer: Commercial Managed Care - PPO | Admitting: Psychiatry

## 2020-11-08 ENCOUNTER — Ambulatory Visit (INDEPENDENT_AMBULATORY_CARE_PROVIDER_SITE_OTHER): Payer: Commercial Managed Care - PPO | Admitting: Adult Health

## 2020-11-08 ENCOUNTER — Other Ambulatory Visit: Payer: Self-pay

## 2020-11-08 ENCOUNTER — Encounter: Payer: Self-pay | Admitting: Adult Health

## 2020-11-08 DIAGNOSIS — F902 Attention-deficit hyperactivity disorder, combined type: Secondary | ICD-10-CM

## 2020-11-08 DIAGNOSIS — F410 Panic disorder [episodic paroxysmal anxiety] without agoraphobia: Secondary | ICD-10-CM

## 2020-11-08 DIAGNOSIS — F341 Dysthymic disorder: Secondary | ICD-10-CM

## 2020-11-08 DIAGNOSIS — G2581 Restless legs syndrome: Secondary | ICD-10-CM

## 2020-11-08 DIAGNOSIS — F411 Generalized anxiety disorder: Secondary | ICD-10-CM | POA: Diagnosis not present

## 2020-11-08 MED ORDER — CLONAZEPAM 0.5 MG PO TABS: 0.5000 mg | ORAL_TABLET | Freq: Three times a day (TID) | ORAL | 2 refills | Status: DC | PRN

## 2020-11-08 MED ORDER — LISDEXAMFETAMINE DIMESYLATE 50 MG PO CAPS: 50.0000 mg | ORAL_CAPSULE | Freq: Every day | ORAL | 0 refills | Status: DC

## 2020-11-08 MED ORDER — GABAPENTIN 100 MG PO CAPS: 200.0000 mg | ORAL_CAPSULE | Freq: Two times a day (BID) | ORAL | 3 refills | Status: DC

## 2020-11-08 MED ORDER — ESCITALOPRAM OXALATE 20 MG PO TABS: 20.0000 mg | ORAL_TABLET | Freq: Every day | ORAL | 1 refills | Status: DC

## 2020-11-08 NOTE — Progress Notes (Signed)
Lucas Sherman [id number] April 13, 2001 19 y.o.  Subjective:   Patient ID:  Lucas Sherman is a 20 y.o. (DOB 23-Aug-2001) male.  Chief Complaint: No chief complaint on file.  HPI Lucas Sherman presents to the office today for follow-up of ADHD, Panic disorder, RLS, Depression, GAD.  Describes mood today as "ok". Pleasant. Mood symptoms - decreased depression, anxiety, and irritability. Recent panic attack - "a lot less seldom". Working on coping mechanisms - meditation and yoga. Stating "I'm doing good". Also stating "it's a night and day difference from a year ago". Feels like current medications are working well for me. Stable interest and motivation. Taking medications as prescribed.  Energy levels stable. Active, does not have a regular exercise routine.   Enjoys some usual interests and activities. Single. Lives with 2 room-mates. Spending time with family. Appetite adequate. Weight stable - 195 pounds.. Sleeps well most nights. Averages hours. Focus and concentration stable. Completing tasks. Managing aspects of household. Attends [employer]. Works part time - 8 hours a week. Denies SI or HI.  Denies AH or VH.  Previous medication trials: Denies   Optometrist Office Visit from 10/29/2017 in [facility] Office Visit from 10/25/2016 in [facility] Office Visit from 10/25/2015 in [facility] Office Visit from 09/01/2014 in [facility] Office Visit from 08/04/2013 in [facility]  PHQ-2 Total Score 0 0 0 0 0  PHQ-9 Total Score 0 0 0 1 0       Review of Systems:  Review of Systems  Musculoskeletal: Negative for gait problem.  Neurological: Negative for tremors.  Psychiatric/Behavioral:       Please refer to HPI    Medications: I have reviewed the patient's current medications.  Current Outpatient Medications  Medication Sig Dispense Refill  . clonazePAM (KLONOPIN) 0.5 MG tablet Take 1 tablet (0.5 mg total) by mouth 3 (three) times  daily as needed for anxiety (Panic attack). 90 tablet 2  . escitalopram (LEXAPRO) 20 MG tablet Take 1 tablet (20 mg total) by mouth daily. 90 tablet 1  . gabapentin (NEURONTIN) 100 MG capsule Take 2 capsules (200 mg total) by mouth 2 (two) times daily. 120 capsule 3  . lisdexamfetamine (VYVANSE) 50 MG capsule Take 1 capsule (50 mg total) by mouth daily after breakfast. 30 capsule 0  . [START ON 12/06/2020] lisdexamfetamine (VYVANSE) 50 MG capsule Take 1 capsule (50 mg total) by mouth daily after breakfast. 30 capsule 0  . [START ON 01/03/2021] lisdexamfetamine (VYVANSE) 50 MG capsule Take 1 capsule (50 mg total) by mouth daily after breakfast. 30 capsule 0   No current facility-administered medications for this visit.    Medication Side Effects: None  Allergies:  Allergies  Allergen Reactions  . Reglan [Metoclopramide]     "anxiety"    Past Medical History:  Diagnosis Date  . Anxiety   . Attention deficit hyperactivity disorder (ADHD), combined type, moderate 04/12/2020  . COVID-19   . Persistent depressive disorder with atypical features, currently mild 03/08/2020  . Persistent depressive disorder with atypical features, currently mild 03/08/2020    Family History  Problem Relation Age of Onset  . Depression Mother   . Anxiety disorder Mother   . Depression Maternal Grandmother   . Anxiety disorder Brother   . Panic disorder Brother   . Depression Other   . Alcohol abuse Other   . Arthritis Neg Hx   . Asthma Neg Hx   . Birth defects Neg Hx   .  Cancer Neg Hx   . COPD Neg Hx   . Diabetes Neg Hx   . Drug abuse Neg Hx   . Early death Neg Hx   . Hearing loss Neg Hx   . Heart disease Neg Hx   . Hyperlipidemia Neg Hx   . Learning disabilities Neg Hx   . Kidney disease Neg Hx   . Hypertension Neg Hx   . Mental illness Neg Hx   . Miscarriages / Stillbirths Neg Hx   . Mental retardation Neg Hx   . Stroke Neg Hx   . Vision loss Neg Hx   . Varicose Veins Neg Hx     Social  History   Socioeconomic History  . Marital status: Single    Spouse name: Not on file  . Number of children: Not on file  . Years of education: 32  . Highest education level: High school graduate  Occupational History  . Occupation: Seeking pro baseball.career  Tobacco Use  . Smoking status: Never Smoker  . Smokeless tobacco: Former Clinical biochemist  . Vaping Use: Never used  Substance and Sexual Activity  . Alcohol use: Not Currently    Comment: occasional  . Drug use: Not Currently    Types: Marijuana  . Sexual activity: Not on file  Other Topics Concern  . 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  He describes restless legs in his sleep patterns having to get up for a hot bath or stretches to his legs in order to try to get back to sleep.  Despite longstanding Lexapro and new onset Klonopin, he fears and finds instability still in his illness pattern that undermines his performance in baseball and academics.  He leaves for college 05/18/2019 seeking intervention prior to then after PCP has recently started the Klonopin which helped but affecting tolerance side effects if dosed higher to prevent anxiety or panic.   Social Determinants of Health   Financial Resource Strain: Not on file  Food Insecurity: Not on file  Transportation Needs: Not on file  Physical Activity: Not on file  Stress: Not on file  Social Connections: Not on file  Intimate Partner Violence: Not on file    Past Medical History, Surgical history, Social history, and Family history were reviewed and updated as  appropriate.   Please see review of systems for further details on the patient's review from today.   Objective:   Physical Exam:  There were no vitals taken for this visit.  Physical Exam Constitutional:      General: He is not in acute distress. Musculoskeletal:        General: No deformity.  Neurological:     Mental Status: He is alert and oriented to person, place, and time.     Coordination: Coordination normal.  Psychiatric:        Attention and Perception: Attention and perception normal. He does not perceive auditory or visual hallucinations.        Mood and Affect: Mood normal. Mood is not anxious or depressed. Affect is not labile, blunt, angry or inappropriate.        Speech: Speech normal.  Behavior: Behavior normal.        Thought Content: Thought content normal. Thought content is not paranoid or delusional. Thought content does not include homicidal or suicidal ideation. Thought content does not include homicidal or suicidal plan.        Cognition and Memory: Cognition and memory normal.        Judgment: Judgment normal.     Comments: Insight intact     Lab Review:     Component Value Date/Time   NA 138 05/09/2019 1329   K 3.9 05/09/2019 1329   CL 103 05/09/2019 1329   CO2 24 05/09/2019 1329   GLUCOSE 90 05/09/2019 1329   BUN 10 05/09/2019 1329   CREATININE 1.23 05/09/2019 1329   CALCIUM 9.6 05/09/2019 1329   PROT 7.4 04/08/2019 1836   ALBUMIN 4.6 04/08/2019 1836   AST 29 04/08/2019 1836   ALT 57 (H) 04/08/2019 1836   ALKPHOS 51 04/08/2019 1836   BILITOT 0.7 04/08/2019 1836   GFRNONAA >60 05/09/2019 1329   GFRAA >60 05/09/2019 1329       Component Value Date/Time   WBC 5.4 05/09/2019 1329   RBC 5.57 05/09/2019 1329   HGB 16.1 05/09/2019 1329   HCT 49.2 05/09/2019 1329   PLT 297 05/09/2019 1329   MCV 88.3 05/09/2019 1329   MCH 28.9 05/09/2019 1329   MCHC 32.7 05/09/2019 1329   RDW 13.2 05/09/2019 1329   LYMPHSABS 1.1 04/08/2019 1836    MONOABS 0.6 04/08/2019 1836   EOSABS 0.0 04/08/2019 1836   BASOSABS 0.0 04/08/2019 1836    No results found for: POCLITH, LITHIUM   No results found for: PHENYTOIN, PHENOBARB, VALPROATE, CBMZ   .res Assessment: Plan:    Plan:  PDMP reviewed  1. Vyvanse 50mg  daily 2. Gabapentin 200mg  BID 3. Clonazepam 0.5mg  TID 4. Lexapro 20mg  daily  127/75 - 91  Read and reviewed note with patient for accuracy.   RTC 3/4 weeks  Patient advised to contact office with any questions, adverse effects, or acute worsening in signs and symptoms.  Discussed potential benefits, risk, and side effects of benzodiazepines to include potential risk of tolerance and dependence, as well as possible drowsiness.  Advised patient not to drive if experiencing drowsiness and to take lowest possible effective dose to minimize risk of dependence and tolerance.  Discussed potential benefits, risks, and side effects of stimulants with patient to include increased heart rate, palpitations, insomnia, increased anxiety, increased irritability, or decreased appetite.  Instructed patient to contact office if experiencing any significant tolerability issues.  Diagnoses and all orders for this visit:  Attention deficit hyperactivity disorder (ADHD), combined type, moderate -     lisdexamfetamine (VYVANSE) 50 MG capsule; Take 1 capsule (50 mg total) by mouth daily after breakfast. -     lisdexamfetamine (VYVANSE) 50 MG capsule; Take 1 capsule (50 mg total) by mouth daily after breakfast. -     lisdexamfetamine (VYVANSE) 50 MG capsule; Take 1 capsule (50 mg total) by mouth daily after breakfast.  Generalized anxiety disorder -     escitalopram (LEXAPRO) 20 MG tablet; Take 1 tablet (20 mg total) by mouth daily. -     clonazePAM (KLONOPIN) 0.5 MG tablet; Take 1 tablet (0.5 mg total) by mouth 3 (three) times daily as needed for anxiety (Panic attack).  Panic disorder -     gabapentin (NEURONTIN) 100 MG capsule; Take 2  capsules (200 mg total) by mouth 2 (two) times daily. -     escitalopram (  LEXAPRO) 20 MG tablet; Take 1 tablet (20 mg total) by mouth daily. -     clonazePAM (KLONOPIN) 0.5 MG tablet; Take 1 tablet (0.5 mg total) by mouth 3 (three) times daily as needed for anxiety (Panic attack).  Restless legs syndrome -     gabapentin (NEURONTIN) 100 MG capsule; Take 2 capsules (200 mg total) by mouth 2 (two) times daily.  Persistent depressive disorder with atypical features, currently mild -     escitalopram (LEXAPRO) 20 MG tablet; Take 1 tablet (20 mg total) by mouth daily.     Please see After Visit Summary for patient specific instructions.  Future Appointments  Date Time Provider Department Center  02/06/2021 11:20 AM Shayden Bobier, Thereasa Solo, NP [facility] None    No orders of the defined types were placed in this encounter.   -------------------------------

## 2021-02-04 ENCOUNTER — Other Ambulatory Visit: Payer: Self-pay | Admitting: Adult Health

## 2021-02-04 DIAGNOSIS — F411 Generalized anxiety disorder: Secondary | ICD-10-CM

## 2021-02-04 DIAGNOSIS — F410 Panic disorder [episodic paroxysmal anxiety] without agoraphobia: Secondary | ICD-10-CM

## 2021-02-06 ENCOUNTER — Ambulatory Visit: Payer: Commercial Managed Care - PPO | Admitting: Adult Health

## 2021-02-07 NOTE — Telephone Encounter (Signed)
Last filled 01/08/21

## 2021-02-09 ENCOUNTER — Ambulatory Visit: Payer: Commercial Managed Care - PPO | Admitting: Adult Health

## 2021-02-13 ENCOUNTER — Other Ambulatory Visit: Payer: Self-pay | Admitting: Adult Health

## 2021-02-13 ENCOUNTER — Other Ambulatory Visit: Payer: Self-pay

## 2021-02-13 ENCOUNTER — Encounter: Payer: Self-pay | Admitting: Adult Health

## 2021-02-13 ENCOUNTER — Telehealth: Payer: Self-pay | Admitting: Adult Health

## 2021-02-13 DIAGNOSIS — F410 Panic disorder [episodic paroxysmal anxiety] without agoraphobia: Secondary | ICD-10-CM

## 2021-02-13 DIAGNOSIS — F902 Attention-deficit hyperactivity disorder, combined type: Secondary | ICD-10-CM

## 2021-02-13 DIAGNOSIS — F411 Generalized anxiety disorder: Secondary | ICD-10-CM

## 2021-02-13 MED ORDER — LISDEXAMFETAMINE DIMESYLATE 50 MG PO CAPS: 50.0000 mg | ORAL_CAPSULE | Freq: Every day | ORAL | 0 refills | Status: DC

## 2021-02-13 MED ORDER — CLONAZEPAM 0.5 MG PO TABS: 0.5000 mg | ORAL_TABLET | Freq: Three times a day (TID) | ORAL | 2 refills | Status: DC | PRN

## 2021-02-13 NOTE — Telephone Encounter (Signed)
Pt called and scheduled for 5/18. He needs a refill on his vyvanse 50 mg to the [facility] [address]

## 2021-02-13 NOTE — Telephone Encounter (Signed)
Script sent  

## 2021-02-13 NOTE — Telephone Encounter (Signed)
Last filled 01/08/21 appt 02/15/21.pended

## 2021-02-15 ENCOUNTER — Ambulatory Visit (INDEPENDENT_AMBULATORY_CARE_PROVIDER_SITE_OTHER): Payer: Commercial Managed Care - PPO | Admitting: Adult Health

## 2021-02-15 ENCOUNTER — Encounter: Payer: Self-pay | Admitting: Adult Health

## 2021-02-15 ENCOUNTER — Other Ambulatory Visit: Payer: Self-pay

## 2021-02-15 DIAGNOSIS — F341 Dysthymic disorder: Secondary | ICD-10-CM | POA: Diagnosis not present

## 2021-02-15 DIAGNOSIS — F902 Attention-deficit hyperactivity disorder, combined type: Secondary | ICD-10-CM

## 2021-02-15 DIAGNOSIS — G2581 Restless legs syndrome: Secondary | ICD-10-CM

## 2021-02-15 DIAGNOSIS — F411 Generalized anxiety disorder: Secondary | ICD-10-CM | POA: Diagnosis not present

## 2021-02-15 DIAGNOSIS — F410 Panic disorder [episodic paroxysmal anxiety] without agoraphobia: Secondary | ICD-10-CM

## 2021-02-15 MED ORDER — LISDEXAMFETAMINE DIMESYLATE 50 MG PO CAPS: 50.0000 mg | ORAL_CAPSULE | Freq: Every day | ORAL | 0 refills | Status: DC

## 2021-02-15 MED ORDER — GABAPENTIN 100 MG PO CAPS
200.0000 mg | ORAL_CAPSULE | Freq: Two times a day (BID) | ORAL | 3 refills | Status: DC
Start: 2021-02-15 — End: 2021-03-28

## 2021-02-15 MED ORDER — ESCITALOPRAM OXALATE 20 MG PO TABS: 20.0000 mg | ORAL_TABLET | Freq: Every day | ORAL | 1 refills | Status: DC

## 2021-02-15 MED ORDER — AMPHETAMINE-DEXTROAMPHETAMINE 10 MG PO TABS: 10.0000 mg | ORAL_TABLET | Freq: Every day | ORAL | 0 refills | Status: DC

## 2021-02-15 NOTE — Progress Notes (Signed)
Lucas Sherman [id number] 28-Jun-2001 20 y.o.  Subjective:   Patient ID:  Lucas Sherman is a 20 y.o. (DOB 09/02/2001) male.  Chief Complaint: No chief complaint on file.   HPI Lucas Sherman presents to the office today for follow-up of ADHD, Panic disorder, RLS, Depression, GAD.  Describes mood today as "ok". Pleasant. Mood symptoms - decreased depression, anxiety, and irritability. Recent panic attacks - "occasioanlly". Has more "waves" of panic. Able to utilize coping mechanisms - meditation and yoga. Stating "I'm doing alright". Feels like medications works great. Does struggle with completing late afternoon activities. Unable to take Vyvanse later in the day. Would like to try a short acting stimulant for evening studying. Stable interest and motivation. Taking medications as prescribed.  Energy levels stable. Active, does not have a regular exercise routine.   Enjoys some usual interests and activities. Single. Lives with 2 room-mates. Spending time with family. Appetite adequate. Weight stable - 197 pounds.. Sleeps well most nights. Averages 8 hours. Focus and concentration stable. Completing tasks. Managing aspects of household. Attends [employer]. Works part time - 8 hours a week. Denies SI or HI.  Denies AH or VH.  Previous medication trials: Denies   [facility] Office Visit from 10/29/2017 in [facility] Office Visit from 10/25/2016 in [facility] Office Visit from 10/25/2015 in [facility] Office Visit from 09/01/2014 in [facility] Office Visit from 08/04/2013 in [facility]  PHQ-2 Total Score 0 0 0 0 0  PHQ-9 Total Score 0 0 0 1 0       Review of Systems:  Review of Systems  Musculoskeletal: Negative for gait problem.  Neurological: Negative for tremors.  Psychiatric/Behavioral:       Please refer to HPI    Medications: I have reviewed the patient's current medications.  Current Outpatient Medications  Medication Sig  Dispense Refill  . amphetamine-dextroamphetamine (ADDERALL) 10 MG tablet Take 1 tablet (10 mg total) by mouth daily. 30 tablet 0  . clonazePAM (KLONOPIN) 0.5 MG tablet Take 1 tablet (0.5 mg total) by mouth 3 (three) times daily as needed for anxiety (Panic attack). 90 tablet 2  . escitalopram (LEXAPRO) 20 MG tablet Take 1 tablet (20 mg total) by mouth daily. 90 tablet 1  . gabapentin (NEURONTIN) 100 MG capsule Take 2 capsules (200 mg total) by mouth 2 (two) times daily. 120 capsule 3  . lisdexamfetamine (VYVANSE) 50 MG capsule Take 1 capsule (50 mg total) by mouth daily after breakfast. 30 capsule 0  . [START ON 03/15/2021] lisdexamfetamine (VYVANSE) 50 MG capsule Take 1 capsule (50 mg total) by mouth daily after breakfast. 30 capsule 0  . [START ON 04/12/2021] lisdexamfetamine (VYVANSE) 50 MG capsule Take 1 capsule (50 mg total) by mouth daily after breakfast. 30 capsule 0   No current facility-administered medications for this visit.    Medication Side Effects: None  Allergies:  Allergies  Allergen Reactions  . Reglan [Metoclopramide]     "anxiety"    Past Medical History:  Diagnosis Date  . Anxiety   . Attention deficit hyperactivity disorder (ADHD), combined type, moderate 04/12/2020  . COVID-19   . Persistent depressive disorder with atypical features, currently mild 03/08/2020  . Persistent depressive disorder with atypical features, currently mild 03/08/2020    Past Medical History, Surgical history, Social history, and Family history were reviewed and updated as appropriate.   Please see review of systems for further details on the patient's review from today.   Objective:  Physical Exam:  There were no vitals taken for this visit.  Physical Exam Constitutional:      General: He is not in acute distress. Musculoskeletal:        General: No deformity.  Neurological:     Mental Status: He is alert and oriented to person, place, and time.     Coordination: Coordination  normal.  Psychiatric:        Attention and Perception: Attention and perception normal. He does not perceive auditory or visual hallucinations.        Mood and Affect: Mood normal. Mood is not anxious or depressed. Affect is not labile, blunt, angry or inappropriate.        Speech: Speech normal.        Behavior: Behavior normal.        Thought Content: Thought content normal. Thought content is not paranoid or delusional. Thought content does not include homicidal or suicidal ideation. Thought content does not include homicidal or suicidal plan.        Cognition and Memory: Cognition and memory normal.        Judgment: Judgment normal.     Comments: Insight intact     Lab Review:     Component Value Date/Time   NA 138 05/09/2019 1329   K 3.9 05/09/2019 1329   CL 103 05/09/2019 1329   CO2 24 05/09/2019 1329   GLUCOSE 90 05/09/2019 1329   BUN 10 05/09/2019 1329   CREATININE 1.23 05/09/2019 1329   CALCIUM 9.6 05/09/2019 1329   PROT 7.4 04/08/2019 1836   ALBUMIN 4.6 04/08/2019 1836   AST 29 04/08/2019 1836   ALT 57 (H) 04/08/2019 1836   ALKPHOS 51 04/08/2019 1836   BILITOT 0.7 04/08/2019 1836   GFRNONAA >60 05/09/2019 1329   GFRAA >60 05/09/2019 1329       Component Value Date/Time   WBC 5.4 05/09/2019 1329   RBC 5.57 05/09/2019 1329   HGB 16.1 05/09/2019 1329   HCT 49.2 05/09/2019 1329   PLT 297 05/09/2019 1329   MCV 88.3 05/09/2019 1329   MCH 28.9 05/09/2019 1329   MCHC 32.7 05/09/2019 1329   RDW 13.2 05/09/2019 1329   LYMPHSABS 1.1 04/08/2019 1836   MONOABS 0.6 04/08/2019 1836   EOSABS 0.0 04/08/2019 1836   BASOSABS 0.0 04/08/2019 1836    No results found for: POCLITH, LITHIUM   No results found for: PHENYTOIN, PHENOBARB, VALPROATE, CBMZ   .res Assessment: Plan:     Plan:  PDMP reviewed  1. Vyvanse 50mg  daily 2. Gabapentin 200mg  BID 3. Clonazepam 0.5mg  TID 4. Lexapro 20mg  daily 5. Add Adderall 10mg  daily as needed  130/85 89  Read and reviewed  note with patient for accuracy.   RTC 3/4 weeks  Patient advised to contact office with any questions, adverse effects, or acute worsening in signs and symptoms.  Discussed potential benefits, risk, and side effects of benzodiazepines to include potential risk of tolerance and dependence, as well as possible drowsiness.  Advised patient not to drive if experiencing drowsiness and to take lowest possible effective dose to minimize risk of dependence and tolerance.  Discussed potential benefits, risks, and side effects of stimulants with patient to include increased heart rate, palpitations, insomnia, increased anxiety, increased irritability, or decreased appetite.  Instructed patient to contact office if experiencing any significant tolerability issues.    Diagnoses and all orders for this visit:  Generalized anxiety disorder -     escitalopram (LEXAPRO) 20 MG tablet; Take  1 tablet (20 mg total) by mouth daily.  Panic disorder -     escitalopram (LEXAPRO) 20 MG tablet; Take 1 tablet (20 mg total) by mouth daily. -     gabapentin (NEURONTIN) 100 MG capsule; Take 2 capsules (200 mg total) by mouth 2 (two) times daily.  Persistent depressive disorder with atypical features, currently mild -     escitalopram (LEXAPRO) 20 MG tablet; Take 1 tablet (20 mg total) by mouth daily.  Restless legs syndrome -     gabapentin (NEURONTIN) 100 MG capsule; Take 2 capsules (200 mg total) by mouth 2 (two) times daily.  Attention deficit hyperactivity disorder (ADHD), combined type, moderate -     lisdexamfetamine (VYVANSE) 50 MG capsule; Take 1 capsule (50 mg total) by mouth daily after breakfast. -     lisdexamfetamine (VYVANSE) 50 MG capsule; Take 1 capsule (50 mg total) by mouth daily after breakfast. -     lisdexamfetamine (VYVANSE) 50 MG capsule; Take 1 capsule (50 mg total) by mouth daily after breakfast. -     amphetamine-dextroamphetamine (ADDERALL) 10 MG tablet; Take 1 tablet (10 mg total) by  mouth daily.     Please see After Visit Summary for patient specific instructions.  Future Appointments  Date Time Provider Department Center  05/18/2021  1:00 PM Patrena Santalucia, Thereasa Solo, NP [facility] None    No orders of the defined types were placed in this encounter.   -------------------------------

## 2021-03-06 ENCOUNTER — Telehealth: Payer: Self-pay

## 2021-03-06 NOTE — Telephone Encounter (Signed)
Prior Approval received for AMPHETAMINE-DEXTROAMPHETAMINE 10 MG TABLETS #30 effective 02/28/2021-02/28/2022 with [employer]

## 2021-03-10 ENCOUNTER — Telehealth: Payer: Self-pay | Admitting: Adult Health

## 2021-03-10 NOTE — Telephone Encounter (Signed)
There is no further action from the provider. Approval received see prior authorization.   Sheralyn Boatman, contact pharmacy and ask if something is needed.

## 2021-03-10 NOTE — Telephone Encounter (Signed)
Please review

## 2021-03-10 NOTE — Telephone Encounter (Signed)
Spoke to pharmacy,nothing else is needed

## 2021-03-10 NOTE — Telephone Encounter (Signed)
Pt mother (Chrissy) called in for update on PA says received letter for approval but pharmacy told her they are waiting to hear from provider. Pls RTC [phone]. Pharmacy [facility] [address] Jacky Kindle.

## 2021-03-28 ENCOUNTER — Telehealth: Payer: Self-pay | Admitting: Adult Health

## 2021-03-28 ENCOUNTER — Other Ambulatory Visit: Payer: Self-pay

## 2021-03-28 ENCOUNTER — Other Ambulatory Visit: Payer: Self-pay | Admitting: Adult Health

## 2021-03-28 DIAGNOSIS — F902 Attention-deficit hyperactivity disorder, combined type: Secondary | ICD-10-CM

## 2021-03-28 DIAGNOSIS — G2581 Restless legs syndrome: Secondary | ICD-10-CM

## 2021-03-28 DIAGNOSIS — F410 Panic disorder [episodic paroxysmal anxiety] without agoraphobia: Secondary | ICD-10-CM

## 2021-03-28 NOTE — Telephone Encounter (Signed)
[facility] [address] [facility]. He is requesting Rx refill redirected to [facility] [address] location. He confirmed it is available at that location.

## 2021-03-28 NOTE — Telephone Encounter (Signed)
Cancelled and pended  

## 2021-03-28 NOTE — Telephone Encounter (Signed)
Pended.

## 2021-03-28 NOTE — Telephone Encounter (Signed)
Ok to pend if time.

## 2021-03-29 MED ORDER — LISDEXAMFETAMINE DIMESYLATE 50 MG PO CAPS: 50.0000 mg | ORAL_CAPSULE | Freq: Every day | ORAL | 0 refills | Status: DC

## 2021-03-31 ENCOUNTER — Telehealth: Payer: Self-pay | Admitting: Adult Health

## 2021-03-31 ENCOUNTER — Other Ambulatory Visit: Payer: Self-pay | Admitting: Adult Health

## 2021-03-31 DIAGNOSIS — F902 Attention-deficit hyperactivity disorder, combined type: Secondary | ICD-10-CM

## 2021-03-31 MED ORDER — AMPHETAMINE-DEXTROAMPHETAMINE 10 MG PO TABS: 10.0000 mg | ORAL_TABLET | Freq: Every day | ORAL | 0 refills | Status: DC

## 2021-03-31 NOTE — Telephone Encounter (Signed)
Pt called to report the Adderall 10 mg instant release worked better with less side effect. Send Rx to [facility] [address] . Apt 8/18

## 2021-03-31 NOTE — Telephone Encounter (Signed)
Please review

## 2021-03-31 NOTE — Telephone Encounter (Signed)
Script sent  

## 2021-05-08 ENCOUNTER — Telehealth: Payer: Self-pay | Admitting: Adult Health

## 2021-05-08 NOTE — Telephone Encounter (Signed)
Vyvanse needs to have a PA. Please contact [facility]  for PA. Member ID  [id number]

## 2021-05-08 NOTE — Telephone Encounter (Signed)
PA pending

## 2021-05-18 ENCOUNTER — Telehealth: Payer: Self-pay

## 2021-05-18 ENCOUNTER — Ambulatory Visit: Payer: Commercial Managed Care - PPO | Admitting: Adult Health

## 2021-05-18 NOTE — Telephone Encounter (Signed)
Prior Authorization submitted and approved for VYVANSE 50 MG effective 05/09/2021-05/09/2022 with [employer]

## 2021-06-06 ENCOUNTER — Telehealth: Payer: Self-pay | Admitting: Adult Health

## 2021-06-06 NOTE — Telephone Encounter (Signed)
Next visit is  

## 2021-06-07 ENCOUNTER — Ambulatory Visit (INDEPENDENT_AMBULATORY_CARE_PROVIDER_SITE_OTHER): Payer: Commercial Managed Care - PPO | Admitting: Adult Health

## 2021-06-07 ENCOUNTER — Encounter: Payer: Self-pay | Admitting: Adult Health

## 2021-06-07 ENCOUNTER — Other Ambulatory Visit: Payer: Self-pay

## 2021-06-07 DIAGNOSIS — F411 Generalized anxiety disorder: Secondary | ICD-10-CM | POA: Diagnosis not present

## 2021-06-07 DIAGNOSIS — F902 Attention-deficit hyperactivity disorder, combined type: Secondary | ICD-10-CM

## 2021-06-07 DIAGNOSIS — G2581 Restless legs syndrome: Secondary | ICD-10-CM

## 2021-06-07 DIAGNOSIS — F410 Panic disorder [episodic paroxysmal anxiety] without agoraphobia: Secondary | ICD-10-CM | POA: Diagnosis not present

## 2021-06-07 DIAGNOSIS — F341 Dysthymic disorder: Secondary | ICD-10-CM

## 2021-06-07 MED ORDER — GABAPENTIN 100 MG PO CAPS: ORAL_CAPSULE | ORAL | 5 refills | Status: DC

## 2021-06-07 MED ORDER — AMPHETAMINE-DEXTROAMPHETAMINE 10 MG PO TABS: 10.0000 mg | ORAL_TABLET | Freq: Every day | ORAL | 0 refills | Status: DC

## 2021-06-07 MED ORDER — LISDEXAMFETAMINE DIMESYLATE 50 MG PO CAPS: 50.0000 mg | ORAL_CAPSULE | Freq: Every day | ORAL | 0 refills | Status: DC

## 2021-06-07 MED ORDER — ESCITALOPRAM OXALATE 20 MG PO TABS: 20.0000 mg | ORAL_TABLET | Freq: Every day | ORAL | 1 refills | Status: DC

## 2021-06-07 MED ORDER — CLONAZEPAM 0.5 MG PO TABS: 0.5000 mg | ORAL_TABLET | Freq: Three times a day (TID) | ORAL | 2 refills | Status: DC | PRN

## 2021-06-07 NOTE — Progress Notes (Signed)
Lucas Sherman [id number] 2001/01/03 20 y.o.  Subjective:   Patient ID:  Lucas Sherman is a 20 y.o. (DOB 07-Sep-2001) male.  Chief Complaint: No chief complaint on file.   HPI Lucas Sherman presents to the office today for follow-up of ADHD, Panic disorder, RLS, Depression, GAD.  Describes mood today as "ok". Pleasant. Mood symptoms - decreased depression, anxiety, and irritability - "here and there" Stating "those bad days are very limited and I can work through them". Denies panic attacks. Feels like medications continue to work well. Stable interest and motivation. Taking medications as prescribed.  Energy levels stable. Active, has a regular exercise routine.   Enjoys some usual interests and activities. Single. Lives at home. Spending time with family. Appetite adequate. Weight loss - 5 pounds 196 to 191 pounds.. Sleeps well most nights. Averages 8 hours. Focus and concentration stable. Completing tasks. Managing aspects of household. Attends [employer]. Playing baseball. Denies SI or HI.  Denies AH or VH.  Previous medication trials: Denies   [employer] Office Visit from 10/29/2017 in [facility] Office Visit from 10/25/2016 in [facility] Office Visit from 10/25/2015 in [facility] Office Visit from 09/01/2014 in [facility] Office Visit from 08/04/2013 in [facility]  PHQ-2 Total Score 0 0 0 0 0  PHQ-9 Total Score 0 0 0 1 0        Review of Systems:  Review of Systems  Musculoskeletal:  Negative for gait problem.  Neurological:  Negative for tremors.  Psychiatric/Behavioral:         Please refer to HPI   Medications: I have reviewed the patient's current medications.  Current Outpatient Medications  Medication Sig Dispense Refill   [START ON 07/05/2021] amphetamine-dextroamphetamine (ADDERALL) 10 MG tablet Take 1 tablet (10 mg total) by mouth daily. 30 tablet 0   [START ON 08/02/2021] amphetamine-dextroamphetamine  (ADDERALL) 10 MG tablet Take 1 tablet (10 mg total) by mouth daily. 30 tablet 0   amphetamine-dextroamphetamine (ADDERALL) 10 MG tablet Take 1 tablet (10 mg total) by mouth daily. 30 tablet 0   clonazePAM (KLONOPIN) 0.5 MG tablet Take 1 tablet (0.5 mg total) by mouth 3 (three) times daily as needed for anxiety (Panic attack). 90 tablet 2   escitalopram (LEXAPRO) 20 MG tablet Take 1 tablet (20 mg total) by mouth daily. 90 tablet 1   gabapentin (NEURONTIN) 100 MG capsule TAKE 2 CAPSULES BY MOUTH 2 TIMES DAILY. 120 capsule 5   lisdexamfetamine (VYVANSE) 50 MG capsule Take 1 capsule (50 mg total) by mouth daily after breakfast. 30 capsule 0   [START ON 07/05/2021] lisdexamfetamine (VYVANSE) 50 MG capsule Take 1 capsule (50 mg total) by mouth daily after breakfast. 30 capsule 0   [START ON 08/02/2021] lisdexamfetamine (VYVANSE) 50 MG capsule Take 1 capsule (50 mg total) by mouth daily after breakfast. 30 capsule 0   No current facility-administered medications for this visit.    Medication Side Effects: None  Allergies:  Allergies  Allergen Reactions   Reglan [Metoclopramide]     "anxiety"    Past Medical History:  Diagnosis Date   Anxiety    Attention deficit hyperactivity disorder (ADHD), combined type, moderate 04/12/2020   COVID-19    Persistent depressive disorder with atypical features, currently mild 03/08/2020   Persistent depressive disorder with atypical features, currently mild 03/08/2020    Past Medical History, Surgical history, Social history, and Family history were reviewed and updated as appropriate.   Please see review of  systems for further details on the patient's review from today.   Objective:   Physical Exam:  There were no vitals taken for this visit.  Physical Exam Constitutional:      General: He is not in acute distress. Musculoskeletal:        General: No deformity.  Neurological:     Mental Status: He is alert and oriented to person, place, and time.      Coordination: Coordination normal.  Psychiatric:        Attention and Perception: Attention and perception normal. He does not perceive auditory or visual hallucinations.        Mood and Affect: Mood normal. Mood is not anxious or depressed. Affect is not labile, blunt, angry or inappropriate.        Speech: Speech normal.        Behavior: Behavior normal.        Thought Content: Thought content normal. Thought content is not paranoid or delusional. Thought content does not include homicidal or suicidal ideation. Thought content does not include homicidal or suicidal plan.        Cognition and Memory: Cognition and memory normal.        Judgment: Judgment normal.     Comments: Insight intact    Lab Review:     Component Value Date/Time   NA 138 05/09/2019 1329   K 3.9 05/09/2019 1329   CL 103 05/09/2019 1329   CO2 24 05/09/2019 1329   GLUCOSE 90 05/09/2019 1329   BUN 10 05/09/2019 1329   CREATININE 1.23 05/09/2019 1329   CALCIUM 9.6 05/09/2019 1329   PROT 7.4 04/08/2019 1836   ALBUMIN 4.6 04/08/2019 1836   AST 29 04/08/2019 1836   ALT 57 (H) 04/08/2019 1836   ALKPHOS 51 04/08/2019 1836   BILITOT 0.7 04/08/2019 1836   GFRNONAA >60 05/09/2019 1329   GFRAA >60 05/09/2019 1329       Component Value Date/Time   WBC 5.4 05/09/2019 1329   RBC 5.57 05/09/2019 1329   HGB 16.1 05/09/2019 1329   HCT 49.2 05/09/2019 1329   PLT 297 05/09/2019 1329   MCV 88.3 05/09/2019 1329   MCH 28.9 05/09/2019 1329   MCHC 32.7 05/09/2019 1329   RDW 13.2 05/09/2019 1329   LYMPHSABS 1.1 04/08/2019 1836   MONOABS 0.6 04/08/2019 1836   EOSABS 0.0 04/08/2019 1836   BASOSABS 0.0 04/08/2019 1836    No results found for: POCLITH, LITHIUM   No results found for: PHENYTOIN, PHENOBARB, VALPROATE, CBMZ   .res Assessment: Plan:    Plan:  PDMP reviewed  1. Vyvanse 50mg  daily 2. Gabapentin 200mg  BID 3. Clonazepam 0.5mg  TID 4. Lexapro 20mg  daily 5. Adderall 10mg  daily as needed  127/75  65  Read and reviewed note with patient for accuracy.   RTC 3/4 weeks  Patient advised to contact office with any questions, adverse effects, or acute worsening in signs and symptoms.  Discussed potential benefits, risk, and side effects of benzodiazepines to include potential risk of tolerance and dependence, as well as possible drowsiness.  Advised patient not to drive if experiencing drowsiness and to take lowest possible effective dose to minimize risk of dependence and tolerance.  Discussed potential benefits, risks, and side effects of stimulants with patient to include increased heart rate, palpitations, insomnia, increased anxiety, increased irritability, or decreased appetite.  Instructed patient to contact office if experiencing any significant tolerability issues.   Diagnoses and all orders for this visit:  Attention deficit hyperactivity  disorder (ADHD), combined type, moderate -     lisdexamfetamine (VYVANSE) 50 MG capsule; Take 1 capsule (50 mg total) by mouth daily after breakfast. -     lisdexamfetamine (VYVANSE) 50 MG capsule; Take 1 capsule (50 mg total) by mouth daily after breakfast. -     lisdexamfetamine (VYVANSE) 50 MG capsule; Take 1 capsule (50 mg total) by mouth daily after breakfast. -     amphetamine-dextroamphetamine (ADDERALL) 10 MG tablet; Take 1 tablet (10 mg total) by mouth daily.  Generalized anxiety disorder -     escitalopram (LEXAPRO) 20 MG tablet; Take 1 tablet (20 mg total) by mouth daily. -     clonazePAM (KLONOPIN) 0.5 MG tablet; Take 1 tablet (0.5 mg total) by mouth 3 (three) times daily as needed for anxiety (Panic attack).  Panic disorder -     escitalopram (LEXAPRO) 20 MG tablet; Take 1 tablet (20 mg total) by mouth daily. -     clonazePAM (KLONOPIN) 0.5 MG tablet; Take 1 tablet (0.5 mg total) by mouth 3 (three) times daily as needed for anxiety (Panic attack). -     gabapentin (NEURONTIN) 100 MG capsule; TAKE 2 CAPSULES BY MOUTH 2 TIMES  DAILY.  Persistent depressive disorder with atypical features, currently mild -     escitalopram (LEXAPRO) 20 MG tablet; Take 1 tablet (20 mg total) by mouth daily.  Restless legs syndrome -     gabapentin (NEURONTIN) 100 MG capsule; TAKE 2 CAPSULES BY MOUTH 2 TIMES DAILY. -     amphetamine-dextroamphetamine (ADDERALL) 10 MG tablet; Take 1 tablet (10 mg total) by mouth daily. -     amphetamine-dextroamphetamine (ADDERALL) 10 MG tablet; Take 1 tablet (10 mg total) by mouth daily.    Please see After Visit Summary for patient specific instructions.  No future appointments.  No orders of the defined types were placed in this encounter.   -------------------------------

## 2021-06-14 ENCOUNTER — Other Ambulatory Visit: Payer: Self-pay | Admitting: Adult Health

## 2021-06-14 DIAGNOSIS — F410 Panic disorder [episodic paroxysmal anxiety] without agoraphobia: Secondary | ICD-10-CM

## 2021-06-14 DIAGNOSIS — G2581 Restless legs syndrome: Secondary | ICD-10-CM

## 2021-07-28 ENCOUNTER — Telehealth: Payer: Self-pay | Admitting: Adult Health

## 2021-07-28 NOTE — Telephone Encounter (Signed)
Next visit is 09/06/21. [facility] told him that his refill for 11/22 is denied and he needed to call us. He didn't get his last refill in October because he was out of town. He doesn't have anymore left because he skipped the October refill. He is requesting to get the November refill for Vyvanse filled early but date is 08/02/21. Can this be refilled now?   Pharmacy is:  [id number] [place], [place] - [address]  Phone:  [phone]  Fax:  [phone]

## 2021-08-16 ENCOUNTER — Other Ambulatory Visit: Payer: Self-pay | Admitting: Adult Health

## 2021-08-16 DIAGNOSIS — F410 Panic disorder [episodic paroxysmal anxiety] without agoraphobia: Secondary | ICD-10-CM

## 2021-08-16 DIAGNOSIS — F411 Generalized anxiety disorder: Secondary | ICD-10-CM

## 2021-09-06 ENCOUNTER — Ambulatory Visit: Payer: Commercial Managed Care - PPO | Admitting: Adult Health

## 2021-09-06 NOTE — Progress Notes (Signed)
Patient no show appointment. ? ?

## 2021-09-14 ENCOUNTER — Other Ambulatory Visit: Payer: Self-pay

## 2021-09-14 ENCOUNTER — Encounter: Payer: Self-pay | Admitting: Adult Health

## 2021-09-14 ENCOUNTER — Ambulatory Visit (INDEPENDENT_AMBULATORY_CARE_PROVIDER_SITE_OTHER): Payer: Commercial Managed Care - PPO | Admitting: Adult Health

## 2021-09-14 DIAGNOSIS — F902 Attention-deficit hyperactivity disorder, combined type: Secondary | ICD-10-CM | POA: Diagnosis not present

## 2021-09-14 DIAGNOSIS — F411 Generalized anxiety disorder: Secondary | ICD-10-CM | POA: Diagnosis not present

## 2021-09-14 DIAGNOSIS — G2581 Restless legs syndrome: Secondary | ICD-10-CM

## 2021-09-14 DIAGNOSIS — F341 Dysthymic disorder: Secondary | ICD-10-CM

## 2021-09-14 DIAGNOSIS — F410 Panic disorder [episodic paroxysmal anxiety] without agoraphobia: Secondary | ICD-10-CM

## 2021-09-14 MED ORDER — CLONAZEPAM 0.5 MG PO TABS: 0.5000 mg | ORAL_TABLET | Freq: Three times a day (TID) | ORAL | 0 refills | Status: DC | PRN

## 2021-09-14 MED ORDER — AMPHETAMINE-DEXTROAMPHETAMINE 10 MG PO TABS: 10.0000 mg | ORAL_TABLET | Freq: Every day | ORAL | 0 refills | Status: DC

## 2021-09-14 MED ORDER — ESCITALOPRAM OXALATE 20 MG PO TABS: 20.0000 mg | ORAL_TABLET | Freq: Every day | ORAL | 1 refills | Status: DC

## 2021-09-14 MED ORDER — LISDEXAMFETAMINE DIMESYLATE 50 MG PO CAPS: 50.0000 mg | ORAL_CAPSULE | Freq: Every day | ORAL | 0 refills | Status: DC

## 2021-09-14 MED ORDER — GABAPENTIN 100 MG PO CAPS: ORAL_CAPSULE | ORAL | 5 refills | Status: DC

## 2021-09-14 NOTE — Progress Notes (Signed)
Lucas Sherman [id number] 02-May-2001 20 y.o.  Subjective:   Patient ID:  Lucas Sherman is a 20 y.o. (DOB May 17, 2001) male.  Chief Complaint: No chief complaint on file.   HPI Lucas Sherman presents to the office today for follow-up of ADHD, Panic disorder, RLS, Depression, GAD.  Describes mood today as "ok". Pleasant. Denies tearfulness. Mood symptoms - decreased depression, anxiety, and irritability. Denies recent panic attacks. Stating "I'm doing alright". Feels like medications continue to work well. Stable interest and motivation. Taking medications as presribed.  Energy levels stable. Active, has a regular exercise routine.   Enjoys some usual interests and activities. Single. Not dating. Lives at home. Spending time with family. Appetite adequate. Weight loss -187 from 196 - 68". Sleeps well most nights. Averages 7 to 8 hours. Focus and concentration stable. Completing tasks. Managing aspects of household. Attends [employer]. Playing baseball. Denies SI or HI.  Denies AH or VH.   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  Current Outpatient Medications  Medication Sig Dispense Refill   amphetamine-dextroamphetamine (ADDERALL) 10 MG tablet Take 1 tablet (10 mg total) by mouth daily. 30 tablet 0   amphetamine-dextroamphetamine (ADDERALL) 10 MG tablet Take 1 tablet (10 mg total) by mouth daily. 30 tablet 0   amphetamine-dextroamphetamine (ADDERALL) 10 MG tablet Take 1 tablet (10 mg total) by mouth daily. 30 tablet 0   clonazePAM (KLONOPIN)  0.5 MG tablet TAKE 1 TABLET (0.5 MG TOTAL) BY MOUTH 3 (THREE) TIMES DAILY AS NEEDED FOR ANXIETY (PANIC ATTACK). 90 tablet 0   escitalopram (LEXAPRO) 20 MG tablet Take 1 tablet (20 mg total) by mouth daily. 90 tablet 1   gabapentin (NEURONTIN) 100 MG capsule TAKE 2 CAPSULES BY MOUTH 2 TIMES DAILY. 120 capsule 5   lisdexamfetamine (VYVANSE) 50 MG capsule Take 1 capsule (50 mg total) by mouth daily after breakfast. 30 capsule 0   lisdexamfetamine (VYVANSE) 50 MG capsule Take 1 capsule (50 mg total) by mouth daily after breakfast. 30 capsule 0   lisdexamfetamine (VYVANSE) 50 MG capsule Take 1 capsule (50 mg total) by mouth daily after breakfast. 30 capsule 0   No current facility-administered medications for this visit.    Medication Side Effects: None  Allergies:  Allergies  Allergen Reactions   Reglan [Metoclopramide]     "anxiety"    Past Medical History:  Diagnosis Date   Anxiety    Attention deficit hyperactivity disorder (ADHD), combined type, moderate 04/12/2020   COVID-19    Persistent depressive disorder with atypical features, currently mild 03/08/2020   Persistent depressive disorder with atypical features, currently mild 03/08/2020    Past Medical History, Surgical history, Social history, and Family history were reviewed and updated as appropriate.   Please see review of systems for further details on the patient's review from today.   Objective:   Physical Exam:  There were no vitals taken for this visit.  Physical Exam  Lab Review:     Component Value Date/Time   NA 138 05/09/2019 1329  K 3.9 05/09/2019 1329   CL 103 05/09/2019 1329   CO2 24 05/09/2019 1329   GLUCOSE 90 05/09/2019 1329   BUN 10 05/09/2019 1329   CREATININE 1.23 05/09/2019 1329   CALCIUM 9.6 05/09/2019 1329   PROT 7.4 04/08/2019 1836   ALBUMIN 4.6 04/08/2019 1836   AST 29 04/08/2019 1836   ALT 57 (H) 04/08/2019 1836   ALKPHOS 51 04/08/2019 1836   BILITOT 0.7 04/08/2019 1836   GFRNONAA >60  05/09/2019 1329   GFRAA >60 05/09/2019 1329       Component Value Date/Time   WBC 5.4 05/09/2019 1329   RBC 5.57 05/09/2019 1329   HGB 16.1 05/09/2019 1329   HCT 49.2 05/09/2019 1329   PLT 297 05/09/2019 1329   MCV 88.3 05/09/2019 1329   MCH 28.9 05/09/2019 1329   MCHC 32.7 05/09/2019 1329   RDW 13.2 05/09/2019 1329   LYMPHSABS 1.1 04/08/2019 1836   MONOABS 0.6 04/08/2019 1836   EOSABS 0.0 04/08/2019 1836   BASOSABS 0.0 04/08/2019 1836    No results found for: POCLITH, LITHIUM   No results found for: PHENYTOIN, PHENOBARB, VALPROATE, CBMZ   .res Assessment: Plan:    Plan:  PDMP reviewed  1. Vyvanse 50mg  daily 2. Gabapentin 200mg  BID 3. Clonazepam 0.5mg  TID 4. Lexapro 20mg  daily 5. Adderall 10mg  daily as needed  111/65/66  Read and reviewed note with patient for accuracy.   RTC 3 months  Patient advised to contact office with any questions, adverse effects, or acute worsening in signs and symptoms.  Discussed potential benefits, risk, and side effects of benzodiazepines to include potential risk of tolerance and dependence, as well as possible drowsiness.  Advised patient not to drive if experiencing drowsiness and to take lowest possible effective dose to minimize risk of dependence and tolerance.  Discussed potential benefits, risks, and side effects of stimulants with patient to include increased heart rate, palpitations, insomnia, increased anxiety, increased irritability, or decreased appetite.  Instructed patient to contact office if experiencing any significant tolerability issues.   There are no diagnoses linked to this encounter.   Please see After Visit Summary for patient specific instructions.  No future appointments.  No orders of the defined types were placed in this encounter.   -------------------------------

## 2021-10-16 ENCOUNTER — Other Ambulatory Visit: Payer: Self-pay | Admitting: Adult Health

## 2021-10-16 DIAGNOSIS — F410 Panic disorder [episodic paroxysmal anxiety] without agoraphobia: Secondary | ICD-10-CM

## 2021-10-16 DIAGNOSIS — F411 Generalized anxiety disorder: Secondary | ICD-10-CM

## 2021-11-23 ENCOUNTER — Telehealth: Payer: Self-pay | Admitting: Adult Health

## 2021-11-23 ENCOUNTER — Other Ambulatory Visit: Payer: Self-pay

## 2021-11-23 NOTE — Telephone Encounter (Signed)
Patient has Rx pending at the requested pharmacy. LVM for him to call and verify availability.

## 2021-11-23 NOTE — Telephone Encounter (Signed)
Pt [facility] [address]

## 2021-11-24 ENCOUNTER — Other Ambulatory Visit: Payer: Self-pay | Admitting: Adult Health

## 2021-11-24 DIAGNOSIS — G2581 Restless legs syndrome: Secondary | ICD-10-CM

## 2021-11-24 DIAGNOSIS — F410 Panic disorder [episodic paroxysmal anxiety] without agoraphobia: Secondary | ICD-10-CM

## 2021-12-14 ENCOUNTER — Other Ambulatory Visit: Payer: Self-pay | Admitting: Adult Health

## 2021-12-14 DIAGNOSIS — F410 Panic disorder [episodic paroxysmal anxiety] without agoraphobia: Secondary | ICD-10-CM

## 2021-12-14 DIAGNOSIS — F411 Generalized anxiety disorder: Secondary | ICD-10-CM

## 2022-01-08 ENCOUNTER — Other Ambulatory Visit: Payer: Self-pay | Admitting: Adult Health

## 2022-01-08 ENCOUNTER — Telehealth: Payer: Self-pay | Admitting: Adult Health

## 2022-01-08 DIAGNOSIS — F902 Attention-deficit hyperactivity disorder, combined type: Secondary | ICD-10-CM

## 2022-01-08 MED ORDER — LISDEXAMFETAMINE DIMESYLATE 50 MG PO CAPS: 50.0000 mg | ORAL_CAPSULE | Freq: Every day | ORAL | 0 refills | Status: DC

## 2022-01-08 NOTE — Telephone Encounter (Signed)
Patient's mom (Chrissy) lvm at 9:06 today stating that when [facility] checked pharmacy for Vyvanse 50mg  prescription he was informed they didn't have any in stock and they unable to switch it somewhere else. Returned call to patient and advised her to call a different pharmacy and to return call to CR when she found a pharmacy. She returned called and says she found a pharmacy with the Vyvanse in stock. Ph: [phone](mom). [facility] [address] ?

## 2022-01-08 NOTE — Telephone Encounter (Signed)
Script sent  

## 2022-01-15 ENCOUNTER — Other Ambulatory Visit: Payer: Self-pay | Admitting: Adult Health

## 2022-01-15 DIAGNOSIS — F410 Panic disorder [episodic paroxysmal anxiety] without agoraphobia: Secondary | ICD-10-CM

## 2022-01-15 DIAGNOSIS — F411 Generalized anxiety disorder: Secondary | ICD-10-CM

## 2022-01-15 NOTE — Telephone Encounter (Signed)
RF for klonopin. Forwarded to [employer]. ?

## 2022-02-06 ENCOUNTER — Telehealth: Payer: Self-pay | Admitting: Adult Health

## 2022-02-06 ENCOUNTER — Other Ambulatory Visit: Payer: Self-pay | Admitting: Adult Health

## 2022-02-06 DIAGNOSIS — F902 Attention-deficit hyperactivity disorder, combined type: Secondary | ICD-10-CM

## 2022-02-06 MED ORDER — AMPHETAMINE-DEXTROAMPHETAMINE 10 MG PO TABS: 10.0000 mg | ORAL_TABLET | Freq: Every day | ORAL | 0 refills | Status: DC

## 2022-02-06 NOTE — Telephone Encounter (Signed)
Script sent  

## 2022-02-06 NOTE — Telephone Encounter (Signed)
Lucas Sherman returned our call today at 2:48 to verify that the [facility] [address] does have the Adderall 10mg .  Please send the prescription to them. ?

## 2022-02-13 ENCOUNTER — Telehealth: Payer: Self-pay | Admitting: Adult Health

## 2022-02-13 ENCOUNTER — Other Ambulatory Visit: Payer: Self-pay | Admitting: Adult Health

## 2022-02-13 DIAGNOSIS — F410 Panic disorder [episodic paroxysmal anxiety] without agoraphobia: Secondary | ICD-10-CM

## 2022-02-13 DIAGNOSIS — F411 Generalized anxiety disorder: Secondary | ICD-10-CM

## 2022-02-13 NOTE — Telephone Encounter (Signed)
Pt due for follow up in March. Last visit with Rene Kocher was 08/2021 ?Last refill April ?

## 2022-02-14 NOTE — Telephone Encounter (Signed)
Lvm for pt to call and schedule

## 2022-02-20 ENCOUNTER — Telehealth: Payer: Self-pay | Admitting: Adult Health

## 2022-02-20 ENCOUNTER — Other Ambulatory Visit: Payer: Self-pay | Admitting: Adult Health

## 2022-02-20 DIAGNOSIS — F902 Attention-deficit hyperactivity disorder, combined type: Secondary | ICD-10-CM

## 2022-02-20 MED ORDER — LISDEXAMFETAMINE DIMESYLATE 50 MG PO CAPS: 50.0000 mg | ORAL_CAPSULE | Freq: Every day | ORAL | 0 refills | Status: DC

## 2022-02-20 NOTE — Telephone Encounter (Signed)
Script sent  

## 2022-02-20 NOTE — Telephone Encounter (Signed)
Pt's mom is on DPR.  She requested refill of Vyvanse to   [id number], [place] - [address] AT [facility] [address]  [address] [place] [address]  Phone:  [phone]  Fax:  [phone]   Next appt 6/19

## 2022-02-20 NOTE — Telephone Encounter (Signed)
Pt's mom scheduled appt for 6/19.  Looks like this encounter for the refill for Clonazepam has been done?

## 2022-03-13 ENCOUNTER — Telehealth: Payer: Self-pay | Admitting: Adult Health

## 2022-03-13 NOTE — Telephone Encounter (Signed)
Dad needs rx sent to pharmacy in [facility] for adderall.He will call back with pharmacy info

## 2022-03-13 NOTE — Telephone Encounter (Signed)
Patient's dad lvm [place] . Contact Jukka at # [phone]

## 2022-03-14 ENCOUNTER — Other Ambulatory Visit: Payer: Self-pay | Admitting: Psychiatry

## 2022-03-14 DIAGNOSIS — F410 Panic disorder [episodic paroxysmal anxiety] without agoraphobia: Secondary | ICD-10-CM

## 2022-03-14 DIAGNOSIS — F411 Generalized anxiety disorder: Secondary | ICD-10-CM

## 2022-03-14 NOTE — Telephone Encounter (Signed)
Last filled 5/16 appt on 6/19

## 2022-03-19 ENCOUNTER — Telehealth (INDEPENDENT_AMBULATORY_CARE_PROVIDER_SITE_OTHER): Payer: Commercial Managed Care - PPO | Admitting: Adult Health

## 2022-03-19 ENCOUNTER — Encounter: Payer: Self-pay | Admitting: Adult Health

## 2022-03-19 DIAGNOSIS — F411 Generalized anxiety disorder: Secondary | ICD-10-CM | POA: Diagnosis not present

## 2022-03-19 DIAGNOSIS — F410 Panic disorder [episodic paroxysmal anxiety] without agoraphobia: Secondary | ICD-10-CM

## 2022-03-19 DIAGNOSIS — G2581 Restless legs syndrome: Secondary | ICD-10-CM | POA: Diagnosis not present

## 2022-03-19 DIAGNOSIS — F902 Attention-deficit hyperactivity disorder, combined type: Secondary | ICD-10-CM

## 2022-03-19 DIAGNOSIS — F341 Dysthymic disorder: Secondary | ICD-10-CM

## 2022-03-19 MED ORDER — AMPHETAMINE-DEXTROAMPHETAMINE 10 MG PO TABS
10.0000 mg | ORAL_TABLET | Freq: Every day | ORAL | 0 refills | Status: DC
Start: 2022-03-19 — End: 2022-04-18

## 2022-03-19 MED ORDER — AMPHETAMINE-DEXTROAMPHETAMINE 10 MG PO TABS: 10.0000 mg | ORAL_TABLET | Freq: Every day | ORAL | 0 refills | Status: DC

## 2022-03-19 MED ORDER — GABAPENTIN 100 MG PO CAPS: ORAL_CAPSULE | ORAL | 5 refills | Status: DC

## 2022-03-19 MED ORDER — LISDEXAMFETAMINE DIMESYLATE 50 MG PO CAPS: 50.0000 mg | ORAL_CAPSULE | Freq: Every day | ORAL | 0 refills | Status: DC

## 2022-03-19 MED ORDER — CLONAZEPAM 0.5 MG PO TABS: 0.5000 mg | ORAL_TABLET | Freq: Three times a day (TID) | ORAL | 2 refills | Status: DC | PRN

## 2022-03-19 MED ORDER — ESCITALOPRAM OXALATE 20 MG PO TABS: 20.0000 mg | ORAL_TABLET | Freq: Every day | ORAL | 1 refills | Status: DC

## 2022-03-19 MED ORDER — AMPHETAMINE-DEXTROAMPHETAMINE 10 MG PO TABS
10.0000 mg | ORAL_TABLET | Freq: Every day | ORAL | 0 refills | Status: DC
Start: 2022-05-14 — End: 2022-06-19

## 2022-03-19 NOTE — Progress Notes (Signed)
Lucas Sherman [id number] [birth year] 21 y.o.  Virtual Visit via Video Note  I connected with pt @ on 03/19/22 at 11:20 AM EDT by a video enabled telemedicine application and verified that I am speaking with the correct person using two identifiers.   I discussed the limitations of evaluation and management by telemedicine and the availability of in person appointments. The patient expressed understanding and agreed to proceed.  I discussed the assessment and treatment plan with the patient. The patient was provided an opportunity to ask questions and all were answered. The patient agreed with the plan and demonstrated an understanding of the instructions.   The patient was advised to call back or seek an in-person evaluation if the symptoms worsen or if the condition fails to improve as anticipated.  I provided 25 minutes of non-face-to-face time during this encounter.  The patient was located at home.  The provider was located at [facility].   Dorothyann Gibbs, NP   Subjective:   Patient ID:  Lucas Sherman is a 21 y.o. (DOB 27-Mar-2001) male.  Chief Complaint: No chief complaint on file.   HPI Lucas Sherman presents for follow-up of ADHD, Panic disorder, RLS, Depression, GAD.  Describes mood today as "ok". Pleasant. Denies tearfulness. Mood symptoms - denies depression, anxiety, and irritability - "some days are better than others". Denies recent panic attacks. Mood is consistent. Stating "everything is going alright". Feels like medications continue to work well. Stable interest and motivation. Taking medications as presribed.  Energy levels stable. Active, has a regular exercise routine.   Enjoys some usual interests and activities. Single. Not dating. Lives at home. Spending time with family. Appetite adequate. Weight loss -187 - 68". Sleeps well most nights. Averages 7 to 8 hours. Focus and concentration stable. Completing tasks. Managing aspects of household.  Attends [employer]. Playing baseball. Denies SI or HI.  Denies AH or VH.   Previous medication trials: Denies   Review of Systems:  Review of Systems  Musculoskeletal:  Negative for gait problem.  Neurological:  Negative for tremors.  Psychiatric/Behavioral:         Please refer to HPI    Medications: I have reviewed the patient's current medications.  Current Outpatient Medications  Medication Sig Dispense Refill   amphetamine-dextroamphetamine (ADDERALL) 10 MG tablet Take 1 tablet (10 mg total) by mouth daily. 30 tablet 0   [START ON 04/16/2022] amphetamine-dextroamphetamine (ADDERALL) 10 MG tablet Take 1 tablet (10 mg total) by mouth daily. 30 tablet 0   [START ON 05/14/2022] amphetamine-dextroamphetamine (ADDERALL) 10 MG tablet Take 1 tablet (10 mg total) by mouth daily. 30 tablet 0   clonazePAM (KLONOPIN) 0.5 MG tablet Take 1 tablet (0.5 mg total) by mouth 3 (three) times daily as needed for anxiety (Panic attack). 90 tablet 2   escitalopram (LEXAPRO) 20 MG tablet Take 1 tablet (20 mg total) by mouth daily. 90 tablet 1   gabapentin (NEURONTIN) 100 MG capsule TAKE 2 CAPSULES BY MOUTH 2 TIMES DAILY. 120 capsule 5   lisdexamfetamine (VYVANSE) 50 MG capsule Take 1 capsule (50 mg total) by mouth daily after breakfast. 30 capsule 0   [START ON 04/16/2022] lisdexamfetamine (VYVANSE) 50 MG capsule Take 1 capsule (50 mg total) by mouth daily after breakfast. 30 capsule 0   [START ON 05/14/2022] lisdexamfetamine (VYVANSE) 50 MG capsule Take 1 capsule (50 mg total) by mouth daily after breakfast. 30 capsule 0   No current facility-administered medications for this visit.    Medication  Side Effects: None  Allergies:  Allergies  Allergen Reactions   Reglan [Metoclopramide]     "anxiety"    Past Medical History:  Diagnosis Date   Anxiety    Attention deficit hyperactivity disorder (ADHD), combined type, moderate 04/12/2020   COVID-19    Persistent depressive disorder with atypical features,  currently mild 03/08/2020   Persistent depressive disorder with atypical features, currently mild 03/08/2020    Family History  Problem Relation Age of Onset   Depression Mother    Anxiety disorder Mother    Depression Maternal Grandmother    Anxiety disorder Brother    Panic disorder Brother    Depression Other    Alcohol abuse Other    Arthritis Neg Hx    Asthma Neg Hx    Birth defects Neg Hx    Cancer Neg Hx    COPD Neg Hx    Diabetes Neg Hx    Drug abuse Neg Hx    Early death Neg Hx    Hearing loss Neg Hx    Heart disease Neg Hx    Hyperlipidemia Neg Hx    Learning disabilities Neg Hx    Kidney disease Neg Hx    Hypertension Neg Hx    Mental illness Neg Hx    Miscarriages / Stillbirths Neg Hx    Mental retardation Neg Hx    Stroke Neg Hx    Vision loss Neg Hx    Varicose Veins Neg Hx     Social History   Socioeconomic History   Marital status: Single    Spouse name: Not on file   Number of children: Not on file   Years of education: 14   Highest education level: High school graduate  Occupational History   Occupation: Seeking pro [email or web address]  Tobacco Use   Smoking status: Never   Smokeless tobacco: Former  Building services engineer Use: Never used  Substance and Sexual Activity   Alcohol use: Not Currently    Comment: occasional   Drug use: Not Currently    Types: Marijuana   Sexual activity: Not on file  Other Topics Concern   Not on file  Social History Narrative   Scientist, 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  He describes restless legs in his sleep patterns having to get up for a hot bath or stretches to  his legs in order to try to get back to sleep.  Despite longstanding Lexapro and new onset Klonopin, he fears and finds instability still in his illness pattern that undermines his performance in baseball and academics.  He leaves for college 05/18/2019 seeking intervention prior to then after PCP has recently started the Klonopin which helped but affecting tolerance side effects if dosed higher to prevent anxiety or panic.   Social Determinants of Health   Financial Resource Strain: Not on file  Food Insecurity: No Food Insecurity (05/04/2019)   Hunger Vital Sign    Worried About Running Out of Food in the Last Year: Never true    Ran Out of Food in the Last Year: Never true  Transportation Needs: No Transportation Needs (05/04/2019)   PRAPARE - Administrator, Civil Service (Medical): No  Lack of Transportation (Non-Medical): No  Physical Activity: Not on file  Stress: Stress Concern Present (05/04/2019)   [employer] of Occupational Health - Occupational Stress Questionnaire    Feeling of Stress : Rather much  Social Connections: Not on file  Intimate Partner Violence: Not on file    Past Medical History, Surgical history, Social history, and Family history were reviewed and updated as appropriate.   Please see review of systems for further details on the patient's review from today.   Objective:   Physical Exam:  There were no vitals taken for this visit.  Physical Exam Constitutional:      General: He is not in acute distress. Musculoskeletal:        General: No deformity.  Neurological:     Mental Status: He is alert and oriented to person, place, and time.     Coordination: Coordination normal.  Psychiatric:        Attention and Perception: Attention and perception normal. He does not perceive auditory or visual hallucinations.        Mood and Affect: Mood normal. Mood is not anxious or depressed. Affect is not labile, blunt, angry or inappropriate.         Speech: Speech normal.        Behavior: Behavior normal.        Thought Content: Thought content normal. Thought content is not paranoid or delusional. Thought content does not include homicidal or suicidal ideation. Thought content does not include homicidal or suicidal plan.        Cognition and Memory: Cognition and memory normal.        Judgment: Judgment normal.     Comments: Insight intact     Lab Review:     Component Value Date/Time   NA 138 05/09/2019 1329   K 3.9 05/09/2019 1329   CL 103 05/09/2019 1329   CO2 24 05/09/2019 1329   GLUCOSE 90 05/09/2019 1329   BUN 10 05/09/2019 1329   CREATININE 1.23 05/09/2019 1329   CALCIUM 9.6 05/09/2019 1329   PROT 7.4 04/08/2019 1836   ALBUMIN 4.6 04/08/2019 1836   AST 29 04/08/2019 1836   ALT 57 (H) 04/08/2019 1836   ALKPHOS 51 04/08/2019 1836   BILITOT 0.7 04/08/2019 1836   GFRNONAA >60 05/09/2019 1329   GFRAA >60 05/09/2019 1329       Component Value Date/Time   WBC 5.4 05/09/2019 1329   RBC 5.57 05/09/2019 1329   HGB 16.1 05/09/2019 1329   HCT 49.2 05/09/2019 1329   PLT 297 05/09/2019 1329   MCV 88.3 05/09/2019 1329   MCH 28.9 05/09/2019 1329   MCHC 32.7 05/09/2019 1329   RDW 13.2 05/09/2019 1329   LYMPHSABS 1.1 04/08/2019 1836   MONOABS 0.6 04/08/2019 1836   EOSABS 0.0 04/08/2019 1836   BASOSABS 0.0 04/08/2019 1836    No results found for: "POCLITH", "LITHIUM"   No results found for: "PHENYTOIN", "PHENOBARB", "VALPROATE", "CBMZ"   .res Assessment: Plan:    Plan:  PDMP reviewed  1. Vyvanse 50mg  daily 2. Gabapentin 200mg  BID 3. Clonazepam 0.5mg  TID 4. Lexapro 20mg  daily 5. Adderall 10mg  daily as needed  111/65/66  Read and reviewed note with patient for accuracy.   RTC 3 months  Patient advised to contact office with any questions, adverse effects, or acute worsening in signs and symptoms.  Discussed potential benefits, risk, and side effects of benzodiazepines to include potential risk of  tolerance and dependence, as well as possible  drowsiness.  Advised patient not to drive if experiencing drowsiness and to take lowest possible effective dose to minimize risk of dependence and tolerance.  Discussed potential benefits, risks, and side effects of stimulants with patient to include increased heart rate, palpitations, insomnia, increased anxiety, increased irritability, or decreased appetite.  Instructed patient to contact office if experiencing any significant tolerability issues. Diagnoses and all orders for this visit:  Attention deficit hyperactivity disorder (ADHD), combined type, moderate -     lisdexamfetamine (VYVANSE) 50 MG capsule; Take 1 capsule (50 mg total) by mouth daily after breakfast. -     lisdexamfetamine (VYVANSE) 50 MG capsule; Take 1 capsule (50 mg total) by mouth daily after breakfast. -     lisdexamfetamine (VYVANSE) 50 MG capsule; Take 1 capsule (50 mg total) by mouth daily after breakfast. -     amphetamine-dextroamphetamine (ADDERALL) 10 MG tablet; Take 1 tablet (10 mg total) by mouth daily.  Restless legs syndrome -     gabapentin (NEURONTIN) 100 MG capsule; TAKE 2 CAPSULES BY MOUTH 2 TIMES DAILY. -     amphetamine-dextroamphetamine (ADDERALL) 10 MG tablet; Take 1 tablet (10 mg total) by mouth daily. -     amphetamine-dextroamphetamine (ADDERALL) 10 MG tablet; Take 1 tablet (10 mg total) by mouth daily.  Panic disorder -     gabapentin (NEURONTIN) 100 MG capsule; TAKE 2 CAPSULES BY MOUTH 2 TIMES DAILY. -     escitalopram (LEXAPRO) 20 MG tablet; Take 1 tablet (20 mg total) by mouth daily. -     clonazePAM (KLONOPIN) 0.5 MG tablet; Take 1 tablet (0.5 mg total) by mouth 3 (three) times daily as needed for anxiety (Panic attack).  Generalized anxiety disorder -     escitalopram (LEXAPRO) 20 MG tablet; Take 1 tablet (20 mg total) by mouth daily. -     clonazePAM (KLONOPIN) 0.5 MG tablet; Take 1 tablet (0.5 mg total) by mouth 3 (three) times daily as needed  for anxiety (Panic attack).  Persistent depressive disorder with atypical features, currently mild -     escitalopram (LEXAPRO) 20 MG tablet; Take 1 tablet (20 mg total) by mouth daily.     Please see After Visit Summary for patient specific instructions.  No future appointments.   No orders of the defined types were placed in this encounter.     -------------------------------

## 2022-03-27 ENCOUNTER — Telehealth: Payer: Self-pay

## 2022-03-27 NOTE — Telephone Encounter (Addendum)
Prior Authorization initiated and approved Amphetamine-Dextroamphetamine 10MG  tablets ID: [id number] Effective:  03/27/2022 to 03/26/2025

## 2022-04-18 ENCOUNTER — Telehealth: Payer: Self-pay | Admitting: Adult Health

## 2022-04-18 ENCOUNTER — Other Ambulatory Visit: Payer: Self-pay

## 2022-04-18 DIAGNOSIS — G2581 Restless legs syndrome: Secondary | ICD-10-CM

## 2022-04-18 MED ORDER — AMPHETAMINE-DEXTROAMPHETAMINE 10 MG PO TABS
10.0000 mg | ORAL_TABLET | Freq: Every day | ORAL | 0 refills | Status: DC
Start: 2022-04-18 — End: 2022-06-19

## 2022-04-18 NOTE — Telephone Encounter (Signed)
Pended.

## 2022-04-18 NOTE — Telephone Encounter (Signed)
Pt requesting Rx generic Adderall 10 mg 1[facility]d [facility] [address] [address]. Follow up due 9/19

## 2022-05-29 ENCOUNTER — Telehealth: Payer: Self-pay | Admitting: Adult Health

## 2022-05-29 ENCOUNTER — Other Ambulatory Visit: Payer: Self-pay

## 2022-05-29 DIAGNOSIS — F902 Attention-deficit hyperactivity disorder, combined type: Secondary | ICD-10-CM

## 2022-05-29 MED ORDER — LISDEXAMFETAMINE DIMESYLATE 50 MG PO CAPS: 50.0000 mg | ORAL_CAPSULE | Freq: Every day | ORAL | 0 refills | Status: DC

## 2022-05-29 NOTE — Telephone Encounter (Signed)
Pt requesting Rx for generic Vyvanse 50 mg to [facility] [address]. LVM to call for follow up apt.

## 2022-05-29 NOTE — Telephone Encounter (Signed)
Pended.

## 2022-06-01 ENCOUNTER — Telehealth: Payer: Self-pay

## 2022-06-01 NOTE — Telephone Encounter (Signed)
Prior Authorization submitted and approved for VYVANSE 50 MG effective 06/01/2022-06/01/2025 with [facility].

## 2022-06-19 ENCOUNTER — Telehealth (INDEPENDENT_AMBULATORY_CARE_PROVIDER_SITE_OTHER): Payer: Commercial Managed Care - PPO | Admitting: Adult Health

## 2022-06-19 ENCOUNTER — Encounter: Payer: Self-pay | Admitting: Adult Health

## 2022-06-19 DIAGNOSIS — F902 Attention-deficit hyperactivity disorder, combined type: Secondary | ICD-10-CM

## 2022-06-19 DIAGNOSIS — F411 Generalized anxiety disorder: Secondary | ICD-10-CM

## 2022-06-19 DIAGNOSIS — F341 Dysthymic disorder: Secondary | ICD-10-CM

## 2022-06-19 DIAGNOSIS — G2581 Restless legs syndrome: Secondary | ICD-10-CM

## 2022-06-19 DIAGNOSIS — F410 Panic disorder [episodic paroxysmal anxiety] without agoraphobia: Secondary | ICD-10-CM | POA: Diagnosis not present

## 2022-06-19 MED ORDER — CLONAZEPAM 0.5 MG PO TABS: 0.5000 mg | ORAL_TABLET | Freq: Three times a day (TID) | ORAL | 2 refills | Status: DC | PRN

## 2022-06-19 MED ORDER — AMPHETAMINE-DEXTROAMPHETAMINE 10 MG PO TABS: 10.0000 mg | ORAL_TABLET | Freq: Every day | ORAL | 0 refills | Status: DC

## 2022-06-19 MED ORDER — LISDEXAMFETAMINE DIMESYLATE 50 MG PO CAPS: 50.0000 mg | ORAL_CAPSULE | Freq: Every day | ORAL | 0 refills | Status: DC

## 2022-06-19 MED ORDER — ESCITALOPRAM OXALATE 20 MG PO TABS: 20.0000 mg | ORAL_TABLET | Freq: Every day | ORAL | 1 refills | Status: DC

## 2022-06-19 MED ORDER — GABAPENTIN 100 MG PO CAPS: ORAL_CAPSULE | ORAL | 5 refills | Status: DC

## 2022-06-19 NOTE — Progress Notes (Signed)
Lucas Sherman [id number] 27-Aug-2001 21 y.o.  Virtual Visit via Video Note  I connected with pt @ on 06/19/22 at  4:40 PM EDT by a video enabled telemedicine application and verified that I am speaking with the correct person using two identifiers.   I discussed the limitations of evaluation and management by telemedicine and the availability of in person appointments. The patient expressed understanding and agreed to proceed.  I discussed the assessment and treatment plan with the patient. The patient was provided an opportunity to ask questions and all were answered. The patient agreed with the plan and demonstrated an understanding of the instructions.   The patient was advised to call back or seek an in-person evaluation if the symptoms worsen or if the condition fails to improve as anticipated.  I provided 25 minutes of non-face-to-face time during this encounter.  The patient was located at home.  The provider was located at [place].   Aloha Gell, NP   Subjective:   Patient ID:  Lucas Sherman is a 21 y.o. (DOB Feb 18, 2001) male.  Chief Complaint: No chief complaint on file.   HPI Lucas Sherman presents for follow-up of ADHD, Panic disorder, RLS, Depression, GAD.  Describes mood today as "ok". Pleasant. Denies tearfulness. Mood symptoms - denies depression, anxiety and irritability. Denies recent panic attacks. Mood is consistent. Stating "I'm doing alright". Living in [place] - playing baseball. Feels like medications continue to work well. Stable interest and motivation. Taking medications as presribed.  Energy levels stable. Active, has a regular exercise routine.   Enjoys some usual interests and activities. Single. Not dating. Lives alone. Spending time with family. Appetite adequate. Weight loss -173 from 187 - 68". Sleeps well most nights. Averages 6 to 8 hours. Focus and concentration stable. Completing tasks. Managing aspects of household.  Taking college classes. Playing baseball. Denies SI or HI.  Denies AH or VH.   Previous medication trials: Denies    Review of Systems:  Review of Systems  Musculoskeletal:  Negative for gait problem.  Neurological:  Negative for tremors.  Psychiatric/Behavioral:         Please refer to HPI    Medications: I have reviewed the patient's current medications.  Current Outpatient Medications  Medication Sig Dispense Refill   amphetamine-dextroamphetamine (ADDERALL) 10 MG tablet Take 1 tablet (10 mg total) by mouth daily. 30 tablet 0   amphetamine-dextroamphetamine (ADDERALL) 10 MG tablet Take 1 tablet (10 mg total) by mouth daily. 30 tablet 0   amphetamine-dextroamphetamine (ADDERALL) 10 MG tablet Take 1 tablet (10 mg total) by mouth daily. 30 tablet 0   clonazePAM (KLONOPIN) 0.5 MG tablet Take 1 tablet (0.5 mg total) by mouth 3 (three) times daily as needed for anxiety (Panic attack). 90 tablet 2   escitalopram (LEXAPRO) 20 MG tablet Take 1 tablet (20 mg total) by mouth daily. 90 tablet 1   gabapentin (NEURONTIN) 100 MG capsule TAKE 2 CAPSULES BY MOUTH 2 TIMES DAILY. 120 capsule 5   lisdexamfetamine (VYVANSE) 50 MG capsule Take 1 capsule (50 mg total) by mouth daily after breakfast. 30 capsule 0   lisdexamfetamine (VYVANSE) 50 MG capsule Take 1 capsule (50 mg total) by mouth daily after breakfast. 30 capsule 0   lisdexamfetamine (VYVANSE) 50 MG capsule Take 1 capsule (50 mg total) by mouth daily after breakfast. 30 capsule 0   No current facility-administered medications for this visit.    Medication Side Effects: None  Allergies:  Allergies  Allergen Reactions  Reglan [Metoclopramide]     "anxiety"    Past Medical History:  Diagnosis Date   Anxiety    Attention deficit hyperactivity disorder (ADHD), combined type, moderate 04/12/2020   COVID-19    Persistent depressive disorder with atypical features, currently mild 03/08/2020   Persistent depressive disorder with atypical  features, currently mild 03/08/2020    Family History  Problem Relation Age of Onset   Depression Mother    Anxiety disorder Mother    Depression Maternal Grandmother    Anxiety disorder Brother    Panic disorder Brother    Depression Other    Alcohol abuse Other    Arthritis Neg Hx    Asthma Neg Hx    Birth defects Neg Hx    Cancer Neg Hx    COPD Neg Hx    Diabetes Neg Hx    Drug abuse Neg Hx    Early death Neg Hx    Hearing loss Neg Hx    Heart disease Neg Hx    Hyperlipidemia Neg Hx    Learning disabilities Neg Hx    Kidney disease Neg Hx    Hypertension Neg Hx    Mental illness Neg Hx    Miscarriages / Stillbirths Neg Hx    Mental retardation Neg Hx    Stroke Neg Hx    Vision loss Neg Hx    Varicose Veins Neg Hx     Social History   Socioeconomic History   Marital status: Single    Spouse name: Not on file   Number of children: Not on file   Years of education: 14   Highest education level: High school graduate  Occupational History   Occupation: Seeking pro baseball.career  Tobacco Use   Smoking status: Never   Smokeless tobacco: Former  Scientific laboratory technician Use: Never used  Substance and Sexual Activity   Alcohol use: Not Currently    Comment: occasional   Drug use: Not Currently    Types: Marijuana   Sexual activity: Not on file  Other Topics Concern   Not on file  Social History Narrative   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  He describes restless legs in his sleep patterns having to get up for a hot bath or stretches to his legs in order to try to get back to sleep.  Despite longstanding  Lexapro and new onset Klonopin, he fears and finds instability still in his illness pattern that undermines his performance in baseball and academics.  He leaves for college 05/18/2019 seeking intervention prior to then after PCP has recently started the Klonopin which helped but affecting tolerance side effects if dosed higher to prevent anxiety or panic.   Social Determinants of Health   Financial Resource Strain: Not on file  Food Insecurity: No Food Insecurity (05/04/2019)   Hunger Vital Sign    Worried About Running Out of Food in the Last Year: Never true    [place] in the Last Year: Never true  Transportation Needs: No Transportation Needs (05/04/2019)   PRAPARE - Hydrologist (Medical): No    Lack of Transportation (Non-Medical): No  Physical Activity: Not  on file  Stress: Stress Concern Present (05/04/2019)   [place]    Feeling of Stress : Rather much  Social Connections: Not on file  Intimate Partner Violence: Not on file    Past Medical History, Surgical history, Social history, and Family history were reviewed and updated as appropriate.   Please see review of systems for further details on the patient's review from today.   Objective:   Physical Exam:  There were no vitals taken for this visit.  Physical Exam Constitutional:      General: He is not in acute distress. Musculoskeletal:        General: No deformity.  Neurological:     Mental Status: He is alert and oriented to person, place, and time.     Coordination: Coordination normal.  Psychiatric:        Attention and Perception: Attention and perception normal. He does not perceive auditory or visual hallucinations.        Mood and Affect: Mood normal. Mood is not anxious or depressed. Affect is not labile, blunt, angry or inappropriate.        Speech: Speech normal.        Behavior: Behavior normal.         Thought Content: Thought content normal. Thought content is not paranoid or delusional. Thought content does not include homicidal or suicidal ideation. Thought content does not include homicidal or suicidal plan.        Cognition and Memory: Cognition and memory normal.        Judgment: Judgment normal.     Comments: Insight intact     Lab Review:     Component Value Date/Time   NA 138 05/09/2019 1329   K 3.9 05/09/2019 1329   CL 103 05/09/2019 1329   CO2 24 05/09/2019 1329   GLUCOSE 90 05/09/2019 1329   BUN 10 05/09/2019 1329   CREATININE 1.23 05/09/2019 1329   CALCIUM 9.6 05/09/2019 1329   PROT 7.4 04/08/2019 1836   ALBUMIN 4.6 04/08/2019 1836   AST 29 04/08/2019 1836   ALT 57 (H) 04/08/2019 1836   ALKPHOS 51 04/08/2019 1836   BILITOT 0.7 04/08/2019 1836   GFRNONAA >60 05/09/2019 1329   GFRAA >60 05/09/2019 1329       Component Value Date/Time   WBC 5.4 05/09/2019 1329   RBC 5.57 05/09/2019 1329   HGB 16.1 05/09/2019 1329   HCT 49.2 05/09/2019 1329   PLT 297 05/09/2019 1329   MCV 88.3 05/09/2019 1329   MCH 28.9 05/09/2019 1329   MCHC 32.7 05/09/2019 1329   RDW 13.2 05/09/2019 1329   LYMPHSABS 1.1 04/08/2019 1836   MONOABS 0.6 04/08/2019 1836   EOSABS 0.0 04/08/2019 1836   BASOSABS 0.0 04/08/2019 1836    No results found for: "POCLITH", "LITHIUM"   No results found for: "PHENYTOIN", "PHENOBARB", "VALPROATE", "CBMZ"   .res Assessment: Plan:    Plan:  PDMP reviewed  1. Vyvanse 50mg  daily 2. Gabapentin 200mg  BID 3. Clonazepam 0.5mg  TID 4. Lexapro 20mg  daily 5. Adderall 10mg  daily as needed  Monitor BP between visits  RTC 3 months  Patient advised to contact office with any questions, adverse effects, or acute worsening in signs and symptoms.  Discussed potential benefits, risk, and side effects of benzodiazepines to include potential risk of tolerance and dependence, as well as possible drowsiness.  Advised patient not to drive if experiencing  drowsiness and to take lowest possible effective  dose to minimize risk of dependence and tolerance.  Discussed potential benefits, risks, and side effects of stimulants with patient to include increased heart rate, palpitations, insomnia, increased anxiety, increased irritability, or decreased appetite.  Instructed patient to contact office if experiencing any significant tolerability issues.  There are no diagnoses linked to this encounter.   Please see After Visit Summary for patient specific instructions.  No future appointments.  No orders of the defined types were placed in this encounter.     -------------------------------

## 2022-07-09 ENCOUNTER — Other Ambulatory Visit: Payer: Self-pay

## 2022-07-09 ENCOUNTER — Telehealth: Payer: Self-pay | Admitting: Adult Health

## 2022-07-09 DIAGNOSIS — F410 Panic disorder [episodic paroxysmal anxiety] without agoraphobia: Secondary | ICD-10-CM

## 2022-07-09 DIAGNOSIS — F411 Generalized anxiety disorder: Secondary | ICD-10-CM

## 2022-07-09 NOTE — Telephone Encounter (Signed)
Lucas Sherman called today at 9:35 to request that his prescription for [facility]ep[facility] [address] [place], [place] [facility] [address], [place], [place].  The [facility] [address] does not have the medication.  Appt 09/20/22

## 2022-07-09 NOTE — Telephone Encounter (Signed)
Notified patient that RF not due until tomorrow and it will go to the requested pharmacy.

## 2022-07-09 NOTE — Telephone Encounter (Signed)
Addendum to attached message from today. Javion called back and wants to make sure that his RX will be called to [facility], [address], [place], [place] [address]. Phone number is [phone]. [facility] location.

## 2022-07-10 MED ORDER — CLONAZEPAM 0.5 MG PO TABS: 0.5000 mg | ORAL_TABLET | Freq: Three times a day (TID) | ORAL | 0 refills | Status: DC | PRN

## 2022-07-10 NOTE — Telephone Encounter (Signed)
Pended.

## 2022-08-31 ENCOUNTER — Other Ambulatory Visit: Payer: Self-pay

## 2022-08-31 ENCOUNTER — Telehealth: Payer: Self-pay | Admitting: Adult Health

## 2022-08-31 DIAGNOSIS — F902 Attention-deficit hyperactivity disorder, combined type: Secondary | ICD-10-CM

## 2022-08-31 MED ORDER — VYVANSE 50 MG PO CAPS: 50.0000 mg | ORAL_CAPSULE | Freq: Every day | ORAL | 0 refills | Status: DC

## 2022-08-31 NOTE — Telephone Encounter (Signed)
Pended.

## 2022-08-31 NOTE — Telephone Encounter (Signed)
Patient mom lvm stating that pharmacy is out of generic Vyvanse and they have called several pharmacies. She did find a pharmacy that had name brand Vyvanse 50mg  and would like prescription transferred there. She is "calling on his behalf because he is not in a good state." [facility] [address] [place] Ph: [phone](mom) Appt 12/21

## 2022-09-20 ENCOUNTER — Telehealth (INDEPENDENT_AMBULATORY_CARE_PROVIDER_SITE_OTHER): Payer: Commercial Managed Care - PPO | Admitting: Adult Health

## 2022-09-20 ENCOUNTER — Encounter: Payer: Self-pay | Admitting: Adult Health

## 2022-09-20 DIAGNOSIS — G2581 Restless legs syndrome: Secondary | ICD-10-CM | POA: Diagnosis not present

## 2022-09-20 DIAGNOSIS — F902 Attention-deficit hyperactivity disorder, combined type: Secondary | ICD-10-CM

## 2022-09-20 DIAGNOSIS — F411 Generalized anxiety disorder: Secondary | ICD-10-CM | POA: Diagnosis not present

## 2022-09-20 DIAGNOSIS — F410 Panic disorder [episodic paroxysmal anxiety] without agoraphobia: Secondary | ICD-10-CM | POA: Diagnosis not present

## 2022-09-20 MED ORDER — LISDEXAMFETAMINE DIMESYLATE 50 MG PO CAPS: 50.0000 mg | ORAL_CAPSULE | Freq: Every day | ORAL | 0 refills | Status: DC

## 2022-09-20 MED ORDER — AMPHETAMINE-DEXTROAMPHETAMINE 10 MG PO TABS: 10.0000 mg | ORAL_TABLET | Freq: Every day | ORAL | 0 refills | Status: DC

## 2022-09-20 MED ORDER — CLONAZEPAM 0.5 MG PO TABS: 0.5000 mg | ORAL_TABLET | Freq: Three times a day (TID) | ORAL | 2 refills | Status: DC | PRN

## 2022-09-20 NOTE — Progress Notes (Signed)
Lucas Sherman [id number] November 25, 2000 21 y.o.  Virtual Visit via Video Note  I connected with pt @ on 09/20/22 at  5:00 PM EST by a video enabled telemedicine application and verified that I am speaking with the correct person using two identifiers.   I discussed the limitations of evaluation and management by telemedicine and the availability of in person appointments. The patient expressed understanding and agreed to proceed.  I discussed the assessment and treatment plan with the patient. The patient was provided an opportunity to ask questions and all were answered. The patient agreed with the plan and demonstrated an understanding of the instructions.   The patient was advised to call back or seek an in-person evaluation if the symptoms worsen or if the condition fails to improve as anticipated.  I provided 25 minutes of non-face-to-face time during this encounter.  The patient was located at home.  The provider was located at [facility].   Dorothyann Gibbs, NP   Subjective:   Patient ID:  Lucas Sherman is a 21 y.o. (DOB 21-Jul-2001) male.  Chief Complaint: No chief complaint on file.   HPI KAIRI TUFO presents for follow-up of ADHD, Panic disorder, RLS, Depression, GAD.  Describes mood today as "ok". Pleasant. Denies tearfulness. Mood symptoms - denies depression and irritability. Reports anxiety at times. Reports decreased panic attacks. Mood is consistent. Stating "I feel like I'm doing alright". Feels like medications continue to work well. Stable interest and motivation. Taking medications as presribed.  Energy levels stable. Active, has a regular exercise routine.   Enjoys some usual interests and activities. Single. Not dating. Living in [place] - playing baseball.  Spending time with family. Appetite adequate. Weight stable -175 pounds - 68". Sleeps well most nights. Averages 6 to 8 hours. Focus and concentration stable. Completing tasks. Managing  aspects of household. Taking college classes. Playing baseball. Denies SI or HI.  Denies AH or VH.   Previous medication trials: Denies      Review of Systems:  Review of Systems  Musculoskeletal:  Negative for gait problem.  Neurological:  Negative for tremors.  Psychiatric/Behavioral:         Please refer to HPI    Medications: I have reviewed the patient's current medications.  Current Outpatient Medications  Medication Sig Dispense Refill   amphetamine-dextroamphetamine (ADDERALL) 10 MG tablet Take 1 tablet (10 mg total) by mouth daily. 30 tablet 0   [START ON 10/18/2022] amphetamine-dextroamphetamine (ADDERALL) 10 MG tablet Take 1 tablet (10 mg total) by mouth daily. 30 tablet 0   [START ON 11/15/2022] amphetamine-dextroamphetamine (ADDERALL) 10 MG tablet Take 1 tablet (10 mg total) by mouth daily. 30 tablet 0   clonazePAM (KLONOPIN) 0.5 MG tablet Take 1 tablet (0.5 mg total) by mouth 3 (three) times daily as needed for anxiety (Panic attack). 90 tablet 2   escitalopram (LEXAPRO) 20 MG tablet Take 1 tablet (20 mg total) by mouth daily. 90 tablet 1   gabapentin (NEURONTIN) 100 MG capsule TAKE 2 CAPSULES BY MOUTH 2 TIMES DAILY. 120 capsule 5   lisdexamfetamine (VYVANSE) 50 MG capsule Take 1 capsule (50 mg total) by mouth daily after breakfast. 30 capsule 0   [START ON 10/18/2022] lisdexamfetamine (VYVANSE) 50 MG capsule Take 1 capsule (50 mg total) by mouth daily after breakfast. 30 capsule 0   [START ON 11/15/2022] lisdexamfetamine (VYVANSE) 50 MG capsule Take 1 capsule (50 mg total) by mouth daily after breakfast. 30 capsule 0   No current facility-administered  medications for this visit.    Medication Side Effects: None  Allergies:  Allergies  Allergen Reactions   Reglan [Metoclopramide]     "anxiety"    Past Medical History:  Diagnosis Date   Anxiety    Attention deficit hyperactivity disorder (ADHD), combined type, moderate 04/12/2020   COVID-19    Persistent  depressive disorder with atypical features, currently mild 03/08/2020   Persistent depressive disorder with atypical features, currently mild 03/08/2020    Family History  Problem Relation Age of Onset   Depression Mother    Anxiety disorder Mother    Depression Maternal Grandmother    Anxiety disorder Brother    Panic disorder Brother    Depression Other    Alcohol abuse Other    Arthritis Neg Hx    Asthma Neg Hx    Birth defects Neg Hx    Cancer Neg Hx    COPD Neg Hx    Diabetes Neg Hx    Drug abuse Neg Hx    Early death Neg Hx    Hearing loss Neg Hx    Heart disease Neg Hx    Hyperlipidemia Neg Hx    Learning disabilities Neg Hx    Kidney disease Neg Hx    Hypertension Neg Hx    Mental illness Neg Hx    Miscarriages / Stillbirths Neg Hx    Mental retardation Neg Hx    Stroke Neg Hx    Vision loss Neg Hx    Varicose Veins Neg Hx     Social History   Socioeconomic History   Marital status: Single    Spouse name: Not on file   Number of children: Not on file   Years of education: 14   Highest education level: High school graduate  Occupational History   Occupation: Seeking pro baseball.career  Tobacco Use   Smoking status: Never   Smokeless tobacco: Former  Building services engineer Use: Never used  Substance and Sexual Activity   Alcohol use: Not Currently    Comment: occasional   Drug use: Not Currently    Types: Marijuana   Sexual activity: Not on file  Other Topics Concern   Not on file  Social History Narrative   Scientist, 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  He describes restless legs in his sleep patterns  having to get up for a hot bath or stretches to his legs in order to try to get back to sleep.  Despite longstanding Lexapro and new onset Klonopin, he fears and finds instability still in his illness pattern that undermines his performance in baseball and academics.  He leaves for college 05/18/2019 seeking intervention prior to then after PCP has recently started the Klonopin which helped but affecting tolerance side effects if dosed higher to prevent anxiety or panic.   Social Determinants of Health   Financial Resource Strain: Not on file  Food Insecurity: No Food Insecurity (05/04/2019)   Hunger Vital Sign    Worried About Running Out of Food in the Last Year: Never true    Ran Out of Food in the Last Year: Never true  Transportation Needs: No Transportation Needs (05/04/2019)   PRAPARE - Transportation  Lack of Transportation (Medical): No    Lack of Transportation (Non-Medical): No  Physical Activity: Not on file  Stress: Stress Concern Present (05/04/2019)   [employer] of Occupational Health - Occupational Stress Questionnaire    Feeling of Stress : Rather much  Social Connections: Not on file  Intimate Partner Violence: Not on file    Past Medical History, Surgical history, Social history, and Family history were reviewed and updated as appropriate.   Please see review of systems for further details on the patient's review from today.   Objective:   Physical Exam:  There were no vitals taken for this visit.  Physical Exam Constitutional:      General: He is not in acute distress. Musculoskeletal:        General: No deformity.  Neurological:     Mental Status: He is alert and oriented to person, place, and time.     Coordination: Coordination normal.  Psychiatric:        Attention and Perception: Attention and perception normal. He does not perceive auditory or visual hallucinations.        Mood and Affect: Mood normal. Mood is not anxious or depressed. Affect is not  labile, blunt, angry or inappropriate.        Speech: Speech normal.        Behavior: Behavior normal.        Thought Content: Thought content normal. Thought content is not paranoid or delusional. Thought content does not include homicidal or suicidal ideation. Thought content does not include homicidal or suicidal plan.        Cognition and Memory: Cognition and memory normal.        Judgment: Judgment normal.     Comments: Insight intact     Lab Review:     Component Value Date/Time   NA 138 05/09/2019 1329   K 3.9 05/09/2019 1329   CL 103 05/09/2019 1329   CO2 24 05/09/2019 1329   GLUCOSE 90 05/09/2019 1329   BUN 10 05/09/2019 1329   CREATININE 1.23 05/09/2019 1329   CALCIUM 9.6 05/09/2019 1329   PROT 7.4 04/08/2019 1836   ALBUMIN 4.6 04/08/2019 1836   AST 29 04/08/2019 1836   ALT 57 (H) 04/08/2019 1836   ALKPHOS 51 04/08/2019 1836   BILITOT 0.7 04/08/2019 1836   GFRNONAA >60 05/09/2019 1329   GFRAA >60 05/09/2019 1329       Component Value Date/Time   WBC 5.4 05/09/2019 1329   RBC 5.57 05/09/2019 1329   HGB 16.1 05/09/2019 1329   HCT 49.2 05/09/2019 1329   PLT 297 05/09/2019 1329   MCV 88.3 05/09/2019 1329   MCH 28.9 05/09/2019 1329   MCHC 32.7 05/09/2019 1329   RDW 13.2 05/09/2019 1329   LYMPHSABS 1.1 04/08/2019 1836   MONOABS 0.6 04/08/2019 1836   EOSABS 0.0 04/08/2019 1836   BASOSABS 0.0 04/08/2019 1836    No results found for: "POCLITH", "LITHIUM"   No results found for: "PHENYTOIN", "PHENOBARB", "VALPROATE", "CBMZ"   .res Assessment: Plan:    Plan:  PDMP reviewed  1. Vyvanse 50mg  daily 2. Gabapentin 200mg  BID 3. Clonazepam 0.5mg  TID 4. Lexapro 20mg  daily 5. Adderall 10mg  daily as needed  Monitor BP between visits while taking stimulant medication.   RTC 3 months  Patient advised to contact office with any questions, adverse effects, or acute worsening in signs and symptoms.  Discussed potential benefits, risk, and side effects of  benzodiazepines to include potential risk of tolerance  and dependence, as well as possible drowsiness.  Advised patient not to drive if experiencing drowsiness and to take lowest possible effective dose to minimize risk of dependence and tolerance.  Discussed potential benefits, risks, and side effects of stimulants with patient to include increased heart rate, palpitations, insomnia, increased anxiety, increased irritability, or decreased appetite.  Instructed patient to contact office if experiencing any significant tolerability issues.  Diagnoses and all orders for this visit:  Attention deficit hyperactivity disorder (ADHD), combined type, moderate -     lisdexamfetamine (VYVANSE) 50 MG capsule; Take 1 capsule (50 mg total) by mouth daily after breakfast. -     lisdexamfetamine (VYVANSE) 50 MG capsule; Take 1 capsule (50 mg total) by mouth daily after breakfast. -     lisdexamfetamine (VYVANSE) 50 MG capsule; Take 1 capsule (50 mg total) by mouth daily after breakfast. -     amphetamine-dextroamphetamine (ADDERALL) 10 MG tablet; Take 1 tablet (10 mg total) by mouth daily.  Generalized anxiety disorder -     clonazePAM (KLONOPIN) 0.5 MG tablet; Take 1 tablet (0.5 mg total) by mouth 3 (three) times daily as needed for anxiety (Panic attack).  Panic disorder -     clonazePAM (KLONOPIN) 0.5 MG tablet; Take 1 tablet (0.5 mg total) by mouth 3 (three) times daily as needed for anxiety (Panic attack).  Restless legs syndrome -     amphetamine-dextroamphetamine (ADDERALL) 10 MG tablet; Take 1 tablet (10 mg total) by mouth daily. -     amphetamine-dextroamphetamine (ADDERALL) 10 MG tablet; Take 1 tablet (10 mg total) by mouth daily.     Please see After Visit Summary for patient specific instructions.  No future appointments.  No orders of the defined types were placed in this encounter.     -------------------------------

## 2022-09-27 ENCOUNTER — Other Ambulatory Visit: Payer: Self-pay | Admitting: Adult Health

## 2022-09-27 DIAGNOSIS — F410 Panic disorder [episodic paroxysmal anxiety] without agoraphobia: Secondary | ICD-10-CM

## 2022-09-27 DIAGNOSIS — G2581 Restless legs syndrome: Secondary | ICD-10-CM

## 2022-09-27 NOTE — Telephone Encounter (Signed)
Pt called requesting Rx for Gabapentin.

## 2022-10-03 ENCOUNTER — Other Ambulatory Visit: Payer: Self-pay

## 2022-10-03 ENCOUNTER — Telehealth: Payer: Self-pay | Admitting: Adult Health

## 2022-10-03 DIAGNOSIS — G2581 Restless legs syndrome: Secondary | ICD-10-CM

## 2022-10-03 DIAGNOSIS — F410 Panic disorder [episodic paroxysmal anxiety] without agoraphobia: Secondary | ICD-10-CM

## 2022-10-03 MED ORDER — GABAPENTIN 100 MG PO CAPS: ORAL_CAPSULE | ORAL | 0 refills | Status: DC

## 2022-10-03 NOTE — Telephone Encounter (Signed)
Patient's mom lvm stating Lucas Sherman lost his Gabapentin filled on 12/28. She is requesting a new Rx. [name] is freaking out due to no medication per mom. Patient was seen on 12/21 with no follow up scheduled.  Contact information # [phone]

## 2022-10-03 NOTE — Telephone Encounter (Signed)
Yes, ok to refill early

## 2022-10-03 NOTE — Telephone Encounter (Signed)
Early refill ok.

## 2022-11-01 ENCOUNTER — Telehealth: Payer: Self-pay | Admitting: Adult Health

## 2022-11-01 NOTE — Telephone Encounter (Signed)
20 mg is a big dose change from 50 mg.Should we pend 2 of the 20 mg daily?

## 2022-11-01 NOTE — Telephone Encounter (Signed)
Mom called and said that they can't get the vyvanse 50 mg anywhere. [facility] [address] [facility] the brand vyvanse in stock but they only have 20 mg in stock. Please cancel and send in a new script into this pharmacy

## 2022-11-02 ENCOUNTER — Other Ambulatory Visit (HOSPITAL_COMMUNITY): Payer: Self-pay

## 2022-11-02 ENCOUNTER — Other Ambulatory Visit: Payer: Self-pay

## 2022-11-02 DIAGNOSIS — F902 Attention-deficit hyperactivity disorder, combined type: Secondary | ICD-10-CM

## 2022-11-02 MED ORDER — LISDEXAMFETAMINE DIMESYLATE 50 MG PO CAPS
50.0000 mg | ORAL_CAPSULE | Freq: Every day | ORAL | 0 refills | Status: DC
  Filled 2022-11-02: qty 30, 30d supply, fill #0

## 2022-11-02 NOTE — Telephone Encounter (Signed)
Addendum to attached message. [facility] at [place], [place], [place], [place] [address]. Medicine is Vyvanse 50 mg  Phone number [phone]  Per patient it is in stock at [facility].

## 2022-11-02 NOTE — Telephone Encounter (Signed)
LVM and advised to [facility]

## 2022-11-02 NOTE — Telephone Encounter (Signed)
The insurance will not cover brand.

## 2022-11-02 NOTE — Telephone Encounter (Signed)
Pended.

## 2022-11-03 ENCOUNTER — Other Ambulatory Visit (HOSPITAL_COMMUNITY): Payer: Self-pay

## 2022-11-05 ENCOUNTER — Other Ambulatory Visit (HOSPITAL_COMMUNITY): Payer: Self-pay

## 2022-11-05 ENCOUNTER — Other Ambulatory Visit: Payer: Self-pay

## 2022-11-05 DIAGNOSIS — F902 Attention-deficit hyperactivity disorder, combined type: Secondary | ICD-10-CM

## 2022-11-05 MED ORDER — LISDEXAMFETAMINE DIMESYLATE 40 MG PO CAPS
40.0000 mg | ORAL_CAPSULE | Freq: Every day | ORAL | 0 refills | Status: DC
  Filled 2022-11-05 – 2022-11-30 (×2): qty 30, 30d supply, fill #0

## 2022-11-05 NOTE — Telephone Encounter (Signed)
Pended.

## 2022-11-05 NOTE — Telephone Encounter (Signed)
Pt's mom LVM om 2/3 @ 12:08p.  She is on DPR.  They only have Vyvanse 40mg  at [facility].  Pls rewrite script for 40mg  and send it there.   No upcoming appt scheduled.

## 2022-11-06 ENCOUNTER — Other Ambulatory Visit (HOSPITAL_COMMUNITY): Payer: Self-pay

## 2022-11-29 ENCOUNTER — Other Ambulatory Visit: Payer: Self-pay | Admitting: Adult Health

## 2022-11-29 ENCOUNTER — Other Ambulatory Visit (HOSPITAL_COMMUNITY): Payer: Self-pay

## 2022-11-29 DIAGNOSIS — F902 Attention-deficit hyperactivity disorder, combined type: Secondary | ICD-10-CM

## 2022-11-29 NOTE — Telephone Encounter (Signed)
Due 3/4

## 2022-11-30 ENCOUNTER — Other Ambulatory Visit (HOSPITAL_COMMUNITY): Payer: Self-pay

## 2022-12-17 ENCOUNTER — Encounter: Payer: Self-pay | Admitting: Adult Health

## 2022-12-17 ENCOUNTER — Telehealth (INDEPENDENT_AMBULATORY_CARE_PROVIDER_SITE_OTHER): Payer: Commercial Managed Care - PPO | Admitting: Adult Health

## 2022-12-17 DIAGNOSIS — F902 Attention-deficit hyperactivity disorder, combined type: Secondary | ICD-10-CM

## 2022-12-17 DIAGNOSIS — F411 Generalized anxiety disorder: Secondary | ICD-10-CM

## 2022-12-17 DIAGNOSIS — F410 Panic disorder [episodic paroxysmal anxiety] without agoraphobia: Secondary | ICD-10-CM

## 2022-12-17 DIAGNOSIS — F341 Dysthymic disorder: Secondary | ICD-10-CM

## 2022-12-17 DIAGNOSIS — G2581 Restless legs syndrome: Secondary | ICD-10-CM | POA: Diagnosis not present

## 2022-12-17 MED ORDER — ESCITALOPRAM OXALATE 20 MG PO TABS: 20.0000 mg | ORAL_TABLET | Freq: Every day | ORAL | 1 refills | Status: DC

## 2022-12-17 MED ORDER — LISDEXAMFETAMINE DIMESYLATE 40 MG PO CAPS: 40.0000 mg | ORAL_CAPSULE | Freq: Every day | ORAL | 0 refills | Status: DC

## 2022-12-17 MED ORDER — AMPHETAMINE-DEXTROAMPHETAMINE 10 MG PO TABS: 10.0000 mg | ORAL_TABLET | Freq: Every day | ORAL | 0 refills | Status: DC

## 2022-12-17 MED ORDER — LORAZEPAM 0.5 MG PO TABS: 0.5000 mg | ORAL_TABLET | Freq: Three times a day (TID) | ORAL | 2 refills | Status: DC

## 2022-12-17 MED ORDER — LISDEXAMFETAMINE DIMESYLATE 50 MG PO CAPS: 50.0000 mg | ORAL_CAPSULE | Freq: Every day | ORAL | 0 refills | Status: DC

## 2022-12-17 MED ORDER — GABAPENTIN 100 MG PO CAPS: ORAL_CAPSULE | ORAL | 0 refills | Status: DC

## 2022-12-17 NOTE — Progress Notes (Signed)
Lucas Sherman [id number] Jun 29, 2001 22 y.o.  Virtual Visit via Video Note  I connected with pt @ on 12/17/22 at  2:40 PM EDT by a video enabled telemedicine application and verified that I am speaking with the correct person using two identifiers.   I discussed the limitations of evaluation and management by telemedicine and the availability of in person appointments. The patient expressed understanding and agreed to proceed.  I discussed the assessment and treatment plan with the patient. The patient was provided an opportunity to ask questions and all were answered. The patient agreed with the plan and demonstrated an understanding of the instructions.   The patient was advised to call back or seek an in-person evaluation if the symptoms worsen or if the condition fails to improve as anticipated.  I provided 25 minutes of non-face-to-face time during this encounter.  The patient was located at home.  The provider was located at [place].   Aloha Gell, NP   Subjective:   Patient ID:  Lucas Sherman is a 22 y.o. (DOB 2000/12/19) male.  Chief Complaint: No chief complaint on file.   HPI Lucas Sherman presents for follow-up of ADHD, Panic disorder, RLS, Depression, GAD.  Describes mood today as "ok". Pleasant. Denies tearfulness. Mood symptoms - reports increased anxiety at times - "last mont has been hectic". Reports panic attacks. Reports some worry, rumination, and over thinking at times. Mood is consistent. Stating "I feel like I'm doing alright". Feels like medications continue to work well. Stable interest and motivation. Taking medications as presribed.  Energy levels stable. Active, has a regular exercise routine.   Enjoys some usual interests and activities. Single. Has a firlfriend. Living in [place] - playing baseball.  Spending time with family. Appetite adequate. Weight stable - 177 pounds - 68". Sleeps well most nights. Averages 5 to 6  hours. Focus and concentration stable. Completing tasks. Managing aspects of household. Taking college classes. Playing baseball. Denies SI or HI.  Denies AH or VH. Denies self harm. Denies substance use.   Previous medication trials: Denies   Review of Systems:  Review of Systems  Musculoskeletal:  Negative for gait problem.  Neurological:  Negative for tremors.  Psychiatric/Behavioral:         Please refer to HPI    Medications: I have reviewed the patient's current medications.  Current Outpatient Medications  Medication Sig Dispense Refill   amphetamine-dextroamphetamine (ADDERALL) 10 MG tablet Take 1 tablet (10 mg total) by mouth daily. 30 tablet 0   amphetamine-dextroamphetamine (ADDERALL) 10 MG tablet Take 1 tablet (10 mg total) by mouth daily. 30 tablet 0   amphetamine-dextroamphetamine (ADDERALL) 10 MG tablet Take 1 tablet (10 mg total) by mouth daily. 30 tablet 0   clonazePAM (KLONOPIN) 0.5 MG tablet Take 1 tablet (0.5 mg total) by mouth 3 (three) times daily as needed for anxiety (Panic attack). 90 tablet 2   escitalopram (LEXAPRO) 20 MG tablet Take 1 tablet (20 mg total) by mouth daily. 90 tablet 1   gabapentin (NEURONTIN) 100 MG capsule TAKE 2 CAPSULES BY MOUTH TWICE A DAY 120 capsule 0   lisdexamfetamine (VYVANSE) 40 MG capsule Take 1 capsule (40 mg total) by mouth daily after breakfast. 30 capsule 0   lisdexamfetamine (VYVANSE) 50 MG capsule Take 1 capsule (50 mg total) by mouth daily after breakfast. 30 capsule 0   lisdexamfetamine (VYVANSE) 50 MG capsule Take 1 capsule (50 mg total) by mouth daily after breakfast. 30 capsule 0  No current facility-administered medications for this visit.    Medication Side Effects: None  Allergies:  Allergies  Allergen Reactions   Reglan [Metoclopramide]     "anxiety"    Past Medical History:  Diagnosis Date   Anxiety    Attention deficit hyperactivity disorder (ADHD), combined type, moderate 04/12/2020   COVID-19     Persistent depressive disorder with atypical features, currently mild 03/08/2020   Persistent depressive disorder with atypical features, currently mild 03/08/2020    Family History  Problem Relation Age of Onset   Depression Mother    Anxiety disorder Mother    Depression Maternal Grandmother    Anxiety disorder Brother    Panic disorder Brother    Depression Other    Alcohol abuse Other    Arthritis Neg Hx    Asthma Neg Hx    Birth defects Neg Hx    Cancer Neg Hx    COPD Neg Hx    Diabetes Neg Hx    Drug abuse Neg Hx    Early death Neg Hx    Hearing loss Neg Hx    Heart disease Neg Hx    Hyperlipidemia Neg Hx    Learning disabilities Neg Hx    Kidney disease Neg Hx    Hypertension Neg Hx    Mental illness Neg Hx    Miscarriages / Stillbirths Neg Hx    Mental retardation Neg Hx    Stroke Neg Hx    Vision loss Neg Hx    Varicose Veins Neg Hx     Social History   Socioeconomic History   Marital status: Single    Spouse name: Not on file   Number of children: Not on file   Years of education: 14   Highest education level: High school graduate  Occupational History   Occupation: Seeking pro [email or web address]  Tobacco Use   Smoking status: Never   Smokeless tobacco: Former  Scientific laboratory technician Use: Never used  Substance and Sexual Activity   Alcohol use: Not Currently    Comment: occasional   Drug use: Not Currently    Types: Marijuana   Sexual activity: Not on file  Other Topics Concern   Not on file  Social History Narrative   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  He describes restless legs in his sleep  patterns having to get up for a hot bath or stretches to his legs in order to try to get back to sleep.  Despite longstanding Lexapro and new onset Klonopin, he fears and finds instability still in his illness pattern that undermines his performance in baseball and academics.  He leaves for college 05/18/2019 seeking intervention prior to then after PCP has recently started the Klonopin which helped but affecting tolerance side effects if dosed higher to prevent anxiety or panic.   Social Determinants of Health   Financial Resource Strain: Not on file  Food Insecurity: No Food Insecurity (05/04/2019)   Hunger Vital Sign    Worried About Running Out of Food in the Last Year: Never true    Ran Out of Food in the Last Year: Never true  Transportation Needs: No Transportation Needs (05/04/2019)  PRAPARE - Hydrologist (Medical): No    Lack of Transportation (Non-Medical): No  Physical Activity: Not on file  Stress: Stress Concern Present (05/04/2019)   [place]    Feeling of Stress : Rather much  Social Connections: Not on file  Intimate Partner Violence: Not on file    Past Medical History, Surgical history, Social history, and Family history were reviewed and updated as appropriate.   Please see review of systems for further details on the patient's review from today.   Objective:   Physical Exam:  There were no vitals taken for this visit.  Physical Exam Constitutional:      General: He is not in acute distress. Musculoskeletal:        General: No deformity.  Neurological:     Mental Status: He is alert and oriented to person, place, and time.     Coordination: Coordination normal.  Psychiatric:        Attention and Perception: Attention and perception normal. He does not perceive auditory or visual hallucinations.        Mood and Affect: Mood normal. Mood is not anxious or depressed.  Affect is not labile, blunt, angry or inappropriate.        Speech: Speech normal.        Behavior: Behavior normal.        Thought Content: Thought content normal. Thought content is not paranoid or delusional. Thought content does not include homicidal or suicidal ideation. Thought content does not include homicidal or suicidal plan.        Cognition and Memory: Cognition and memory normal.        Judgment: Judgment normal.     Comments: Insight intact     Lab Review:     Component Value Date/Time   NA 138 05/09/2019 1329   K 3.9 05/09/2019 1329   CL 103 05/09/2019 1329   CO2 24 05/09/2019 1329   GLUCOSE 90 05/09/2019 1329   BUN 10 05/09/2019 1329   CREATININE 1.23 05/09/2019 1329   CALCIUM 9.6 05/09/2019 1329   PROT 7.4 04/08/2019 1836   ALBUMIN 4.6 04/08/2019 1836   AST 29 04/08/2019 1836   ALT 57 (H) 04/08/2019 1836   ALKPHOS 51 04/08/2019 1836   BILITOT 0.7 04/08/2019 1836   GFRNONAA >60 05/09/2019 1329   GFRAA >60 05/09/2019 1329       Component Value Date/Time   WBC 5.4 05/09/2019 1329   RBC 5.57 05/09/2019 1329   HGB 16.1 05/09/2019 1329   HCT 49.2 05/09/2019 1329   PLT 297 05/09/2019 1329   MCV 88.3 05/09/2019 1329   MCH 28.9 05/09/2019 1329   MCHC 32.7 05/09/2019 1329   RDW 13.2 05/09/2019 1329   LYMPHSABS 1.1 04/08/2019 1836   MONOABS 0.6 04/08/2019 1836   EOSABS 0.0 04/08/2019 1836   BASOSABS 0.0 04/08/2019 1836    No results found for: "POCLITH", "LITHIUM"   No results found for: "PHENYTOIN", "PHENOBARB", "VALPROATE", "CBMZ"   .res Assessment: Plan:   Plan:  PDMP reviewed  1. Vyvanse 50mg  daily 2. Gabapentin 200mg  BID 3. Clonazepam 0.5mg  TID 4. Lexapro 20mg  daily 5. Adderall 10mg  daily as needed  Monitor BP between visits while taking stimulant medication.   RTC 3 months  Patient advised to contact office with any questions, adverse effects, or acute worsening in signs and symptoms.  Discussed potential benefits, risk, and side  effects of benzodiazepines to  include potential risk of tolerance and dependence, as well as possible drowsiness.  Advised patient not to drive if experiencing drowsiness and to take lowest possible effective dose to minimize risk of dependence and tolerance.  Discussed potential benefits, risks, and side effects of stimulants with patient to include increased heart rate, palpitations, insomnia, increased anxiety, increased irritability, or decreased appetite.  Instructed patient to contact office if experiencing any significant tolerability issues. There are no diagnoses linked to this encounter.   Please see After Visit Summary for patient specific instructions.  Future Appointments  Date Time Provider [place]  12/17/2022  2:40 PM Harika Laidlaw, Berdie Ogren, NP CP-CP None    No orders of the defined types were placed in this encounter.     -------------------------------

## 2022-12-25 ENCOUNTER — Other Ambulatory Visit: Payer: Self-pay | Admitting: Adult Health

## 2022-12-25 ENCOUNTER — Telehealth: Payer: Self-pay | Admitting: Adult Health

## 2022-12-25 DIAGNOSIS — F410 Panic disorder [episodic paroxysmal anxiety] without agoraphobia: Secondary | ICD-10-CM

## 2022-12-25 DIAGNOSIS — F411 Generalized anxiety disorder: Secondary | ICD-10-CM

## 2022-12-25 MED ORDER — CLONAZEPAM 0.5 MG PO TABS: 0.5000 mg | ORAL_TABLET | Freq: Three times a day (TID) | ORAL | 2 refills | Status: DC | PRN

## 2022-12-25 NOTE — Telephone Encounter (Signed)
Mom called and said that Lucas Sherman has strep and mono. He said the anti anxiety medicine is not working. He would like to go back on klonpin. Please call his mom Lucas Sherman at [phone]

## 2022-12-25 NOTE — Telephone Encounter (Signed)
Gina sent in Rx for clonazepam.

## 2022-12-25 NOTE — Telephone Encounter (Signed)
Can you followup with him? He just picked up Ativan.

## 2023-01-02 ENCOUNTER — Other Ambulatory Visit: Payer: Self-pay | Admitting: Adult Health

## 2023-01-02 ENCOUNTER — Other Ambulatory Visit (HOSPITAL_BASED_OUTPATIENT_CLINIC_OR_DEPARTMENT_OTHER): Payer: Self-pay

## 2023-01-02 ENCOUNTER — Telehealth: Payer: Self-pay | Admitting: Adult Health

## 2023-01-02 DIAGNOSIS — F902 Attention-deficit hyperactivity disorder, combined type: Secondary | ICD-10-CM

## 2023-01-02 MED ORDER — LISDEXAMFETAMINE DIMESYLATE 40 MG PO CAPS
40.0000 mg | ORAL_CAPSULE | Freq: Every day | ORAL | 0 refills | Status: DC
Start: 2023-01-02 — End: 2023-03-29
  Filled 2023-01-02: qty 30, 30d supply, fill #0

## 2023-01-02 MED ORDER — LISDEXAMFETAMINE DIMESYLATE 50 MG PO CAPS
50.0000 mg | ORAL_CAPSULE | Freq: Every day | ORAL | 0 refills | Status: DC
Start: 2023-01-02 — End: 2023-03-05
  Filled 2023-01-02: qty 30, 30d supply, fill #0

## 2023-01-02 NOTE — Telephone Encounter (Signed)
Pt called and is unable to get Vyvanse at [facility] in [place] b/c out of stock. He would like the recent Vyvanse RX to be sent to [place] at [facility] this time and he will coordinate to pick up here in [place].

## 2023-01-02 NOTE — Telephone Encounter (Signed)
Script sent  

## 2023-02-04 ENCOUNTER — Telehealth: Payer: Self-pay | Admitting: Adult Health

## 2023-02-04 ENCOUNTER — Other Ambulatory Visit: Payer: Self-pay | Admitting: Adult Health

## 2023-02-04 ENCOUNTER — Other Ambulatory Visit (HOSPITAL_BASED_OUTPATIENT_CLINIC_OR_DEPARTMENT_OTHER): Payer: Self-pay

## 2023-02-04 DIAGNOSIS — F902 Attention-deficit hyperactivity disorder, combined type: Secondary | ICD-10-CM

## 2023-02-04 MED ORDER — LISDEXAMFETAMINE DIMESYLATE 50 MG PO CAPS
50.0000 mg | ORAL_CAPSULE | Freq: Every day | ORAL | 0 refills | Status: DC
Start: 2023-02-04 — End: 2023-03-29
  Filled 2023-02-04 (×2): qty 30, 30d supply, fill #0

## 2023-02-04 NOTE — Telephone Encounter (Signed)
Pt called and said that he needs his generic vyvanse 50 mg sent to [facility] in [place]. They do have it in stock

## 2023-03-05 ENCOUNTER — Other Ambulatory Visit: Payer: Self-pay | Admitting: Adult Health

## 2023-03-05 ENCOUNTER — Telehealth: Payer: Self-pay | Admitting: Adult Health

## 2023-03-05 ENCOUNTER — Other Ambulatory Visit (HOSPITAL_BASED_OUTPATIENT_CLINIC_OR_DEPARTMENT_OTHER): Payer: Self-pay

## 2023-03-05 DIAGNOSIS — F902 Attention-deficit hyperactivity disorder, combined type: Secondary | ICD-10-CM

## 2023-03-05 MED ORDER — LISDEXAMFETAMINE DIMESYLATE 50 MG PO CAPS
50.0000 mg | ORAL_CAPSULE | Freq: Every day | ORAL | 0 refills | Status: DC
Start: 2023-03-05 — End: 2023-03-29
  Filled 2023-03-05: qty 30, 30d supply, fill #0

## 2023-03-05 NOTE — Telephone Encounter (Signed)
Script sent  

## 2023-03-05 NOTE — Telephone Encounter (Signed)
Pt requesting Vyvanse  50 mg Rx to [facility]. Apt 6/28

## 2023-03-07 ENCOUNTER — Other Ambulatory Visit: Payer: Self-pay | Admitting: Adult Health

## 2023-03-07 ENCOUNTER — Telehealth: Payer: Self-pay | Admitting: Adult Health

## 2023-03-07 DIAGNOSIS — G2581 Restless legs syndrome: Secondary | ICD-10-CM

## 2023-03-07 DIAGNOSIS — F410 Panic disorder [episodic paroxysmal anxiety] without agoraphobia: Secondary | ICD-10-CM

## 2023-03-07 NOTE — Telephone Encounter (Signed)
Ppt called and needs a refill on his gabapentin 100 mg. Pharmacy is [facility] [address] in [place]. Next apt 6/26

## 2023-03-07 NOTE — Telephone Encounter (Signed)
Due 6/6

## 2023-03-07 NOTE — Telephone Encounter (Signed)
6/7, not 6/6

## 2023-03-17 ENCOUNTER — Other Ambulatory Visit: Payer: Self-pay | Admitting: Adult Health

## 2023-03-17 DIAGNOSIS — F411 Generalized anxiety disorder: Secondary | ICD-10-CM

## 2023-03-17 DIAGNOSIS — F410 Panic disorder [episodic paroxysmal anxiety] without agoraphobia: Secondary | ICD-10-CM

## 2023-03-27 ENCOUNTER — Ambulatory Visit (INDEPENDENT_AMBULATORY_CARE_PROVIDER_SITE_OTHER): Payer: Self-pay | Admitting: Adult Health

## 2023-03-27 DIAGNOSIS — Z0389 Encounter for observation for other suspected diseases and conditions ruled out: Secondary | ICD-10-CM

## 2023-03-27 NOTE — Progress Notes (Signed)
Patient no show appointment. ? ?

## 2023-03-29 ENCOUNTER — Encounter: Payer: Self-pay | Admitting: Adult Health

## 2023-03-29 ENCOUNTER — Telehealth (INDEPENDENT_AMBULATORY_CARE_PROVIDER_SITE_OTHER): Payer: Commercial Managed Care - PPO | Admitting: Adult Health

## 2023-03-29 DIAGNOSIS — F902 Attention-deficit hyperactivity disorder, combined type: Secondary | ICD-10-CM

## 2023-03-29 DIAGNOSIS — F410 Panic disorder [episodic paroxysmal anxiety] without agoraphobia: Secondary | ICD-10-CM | POA: Diagnosis not present

## 2023-03-29 DIAGNOSIS — F411 Generalized anxiety disorder: Secondary | ICD-10-CM | POA: Diagnosis not present

## 2023-03-29 DIAGNOSIS — G2581 Restless legs syndrome: Secondary | ICD-10-CM

## 2023-03-29 DIAGNOSIS — F341 Dysthymic disorder: Secondary | ICD-10-CM

## 2023-03-29 MED ORDER — GABAPENTIN 100 MG PO CAPS: 200.0000 mg | ORAL_CAPSULE | Freq: Two times a day (BID) | ORAL | 5 refills | Status: DC

## 2023-03-29 MED ORDER — AMPHETAMINE-DEXTROAMPHETAMINE 10 MG PO TABS
10.0000 mg | ORAL_TABLET | Freq: Every day | ORAL | 0 refills | Status: DC
Start: 2023-03-29 — End: 2023-06-14

## 2023-03-29 MED ORDER — LISDEXAMFETAMINE DIMESYLATE 50 MG PO CAPS
50.0000 mg | ORAL_CAPSULE | Freq: Every day | ORAL | 0 refills | Status: DC
Start: 2023-04-26 — End: 2023-05-02

## 2023-03-29 MED ORDER — AMPHETAMINE-DEXTROAMPHETAMINE 10 MG PO TABS: 10.0000 mg | ORAL_TABLET | Freq: Every day | ORAL | 0 refills | Status: DC

## 2023-03-29 MED ORDER — ESCITALOPRAM OXALATE 20 MG PO TABS
20.0000 mg | ORAL_TABLET | Freq: Every day | ORAL | 1 refills | Status: DC
Start: 2023-03-29 — End: 2023-06-14

## 2023-03-29 MED ORDER — LISDEXAMFETAMINE DIMESYLATE 50 MG PO CAPS
50.0000 mg | ORAL_CAPSULE | Freq: Every day | ORAL | 0 refills | Status: DC
Start: 2023-05-24 — End: 2023-06-04

## 2023-03-29 MED ORDER — CLONAZEPAM 0.5 MG PO TABS
0.5000 mg | ORAL_TABLET | Freq: Three times a day (TID) | ORAL | 2 refills | Status: DC | PRN
Start: 2023-03-29 — End: 2023-04-26

## 2023-03-29 MED ORDER — LISDEXAMFETAMINE DIMESYLATE 50 MG PO CAPS
50.0000 mg | ORAL_CAPSULE | Freq: Every day | ORAL | 0 refills | Status: DC
Start: 2023-03-29 — End: 2023-06-14

## 2023-03-29 NOTE — Progress Notes (Signed)
Lucas Sherman [id number] 06/20/2001 22 y.o.  Virtual Visit via Video Note  I connected with pt @ on 03/29/23 at 10:20 AM EDT by a video enabled telemedicine application and verified that I am speaking with the correct person using two identifiers.   I discussed the limitations of evaluation and management by telemedicine and the availability of in person appointments. The patient expressed understanding and agreed to proceed.  I discussed the assessment and treatment plan with the patient. The patient was provided an opportunity to ask questions and all were answered. The patient agreed with the plan and demonstrated an understanding of the instructions.   The patient was advised to call back or seek an in-person evaluation if the symptoms worsen or if the condition fails to improve as anticipated.  I provided 25 minutes of non-face-to-face time during this encounter.  The patient was located at home.  The provider was located at [facility].   Dorothyann Gibbs, NP   Subjective:   Patient ID:  Lucas Sherman is a 22 y.o. (DOB 17-Oct-2000) male.  Chief Complaint: No chief complaint on file.   HPI Lucas Sherman presents for follow-up of ADHD, Panic disorder, RLS, Depression, GAD.  Describes mood today as "ok". Pleasant. Denies tearfulness. Mood symptoms - reports decreased anxiety. Reports panic attacks - "only at night". Denies depression. Reports irritability at times. Reports some worry, rumination, and over thinking at times. Mood is consistent. Stating "I feel like I'm doing alright". Feels like medications continue to work well. Stable interest and motivation. Taking medications as presribed.  Energy levels stable. Active, has a regular exercise routine.   Enjoys some usual interests and activities. Single. Has a girlfriend. Living in [place] - playing baseball.  Spending time with family. Appetite adequate. Weight stable - 177 pounds - 68". Sleeps well most  nights. Averages 5 to 6 hours. Focus and concentration stable. Completing tasks. Managing aspects of household. Taking college classes. Has accepted a job opportunity in - baseball - [employer]. Denies SI or HI.  Denies AH or VH. Denies self harm. Denies substance use.   Previous medication trials: Denies  Review of Systems:  Review of Systems  Musculoskeletal:  Negative for gait problem.  Neurological:  Negative for tremors.  Psychiatric/Behavioral:         Please refer to HPI    Medications: I have reviewed the patient's current medications.  Current Outpatient Medications  Medication Sig Dispense Refill   amphetamine-dextroamphetamine (ADDERALL) 10 MG tablet Take 1 tablet (10 mg total) by mouth daily. 30 tablet 0   [START ON 04/26/2023] amphetamine-dextroamphetamine (ADDERALL) 10 MG tablet Take 1 tablet (10 mg total) by mouth daily. 30 tablet 0   [START ON 05/24/2023] amphetamine-dextroamphetamine (ADDERALL) 10 MG tablet Take 1 tablet (10 mg total) by mouth daily. 30 tablet 0   clonazePAM (KLONOPIN) 0.5 MG tablet Take 1 tablet (0.5 mg total) by mouth 3 (three) times daily as needed for anxiety (Panic attack). 90 tablet 2   escitalopram (LEXAPRO) 20 MG tablet Take 1 tablet (20 mg total) by mouth daily. 90 tablet 1   gabapentin (NEURONTIN) 100 MG capsule Take 2 capsules (200 mg total) by mouth 2 (two) times daily. 120 capsule 5   lisdexamfetamine (VYVANSE) 50 MG capsule Take 1 capsule (50 mg total) by mouth daily after breakfast. 30 capsule 0   [START ON 04/26/2023] lisdexamfetamine (VYVANSE) 50 MG capsule Take 1 capsule (50 mg total) by mouth daily after breakfast. 30 capsule 0   [  START ON 05/24/2023] lisdexamfetamine (VYVANSE) 50 MG capsule Take 1 capsule (50 mg total) by mouth daily after breakfast. 30 capsule 0   No current facility-administered medications for this visit.    Medication Side Effects: None  Allergies:  Allergies  Allergen Reactions   Reglan  [Metoclopramide]     "anxiety"    Past Medical History:  Diagnosis Date   Anxiety    Attention deficit hyperactivity disorder (ADHD), combined type, moderate 04/12/2020   COVID-19    Persistent depressive disorder with atypical features, currently mild 03/08/2020   Persistent depressive disorder with atypical features, currently mild 03/08/2020    Family History  Problem Relation Age of Onset   Depression Mother    Anxiety disorder Mother    Depression Maternal Grandmother    Anxiety disorder Brother    Panic disorder Brother    Depression Other    Alcohol abuse Other    Arthritis Neg Hx    Asthma Neg Hx    Birth defects Neg Hx    Cancer Neg Hx    COPD Neg Hx    Diabetes Neg Hx    Drug abuse Neg Hx    Early death Neg Hx    Hearing loss Neg Hx    Heart disease Neg Hx    Hyperlipidemia Neg Hx    Learning disabilities Neg Hx    Kidney disease Neg Hx    Hypertension Neg Hx    Mental illness Neg Hx    Miscarriages / Stillbirths Neg Hx    Mental retardation Neg Hx    Stroke Neg Hx    Vision loss Neg Hx    Varicose Veins Neg Hx     Social History   Socioeconomic History   Marital status: Single    Spouse name: Not on file   Number of children: Not on file   Years of education: 14   Highest education level: High school graduate  Occupational History   Occupation: Seeking pro baseball.career  Tobacco Use   Smoking status: Never   Smokeless tobacco: Former  Building services engineer Use: Never used  Substance and Sexual Activity   Alcohol use: Not Currently    Comment: occasional   Drug use: Not Currently    Types: Marijuana   Sexual activity: Not on file  Other Topics Concern   Not on file  Social History Narrative   Scientist, 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  He describes restless legs in his sleep patterns having to get up for a hot bath or stretches to his legs in order to try to get back to sleep.  Despite longstanding Lexapro and new onset Klonopin, he fears and finds instability still in his illness pattern that undermines his performance in baseball and academics.  He leaves for college 05/18/2019 seeking intervention prior to then after PCP has recently started the Klonopin which helped but affecting tolerance side effects if dosed higher to prevent anxiety or panic.   Social Determinants of Health   Financial Resource Strain: Not on file  Food Insecurity: No Food Insecurity (05/04/2019)   Hunger Vital Sign    Worried About Running Out of Food in the Last Year:  Never true    Ran Out of Food in the Last Year: Never true  Transportation Needs: No Transportation Needs (05/04/2019)   PRAPARE - Administrator, Civil Service (Medical): No    Lack of Transportation (Non-Medical): No  Physical Activity: Not on file  Stress: Stress Concern Present (05/04/2019)   [employer] of Occupational Health - Occupational Stress Questionnaire    Feeling of Stress : Rather much  Social Connections: Not on file  Intimate Partner Violence: Not on file    Past Medical History, Surgical history, Social history, and Family history were reviewed and updated as appropriate.   Please see review of systems for further details on the patient's review from today.   Objective:   Physical Exam:  There were no vitals taken for this visit.  Physical Exam Constitutional:      General: He is not in acute distress. Musculoskeletal:        General: No deformity.  Neurological:     Mental Status: He is alert and oriented to person, place, and time.     Coordination: Coordination normal.  Psychiatric:         Attention and Perception: Attention and perception normal. He does not perceive auditory or visual hallucinations.        Mood and Affect: Mood normal. Mood is not anxious or depressed. Affect is not labile, blunt, angry or inappropriate.        Speech: Speech normal.        Behavior: Behavior normal.        Thought Content: Thought content normal. Thought content is not paranoid or delusional. Thought content does not include homicidal or suicidal ideation. Thought content does not include homicidal or suicidal plan.        Cognition and Memory: Cognition and memory normal.        Judgment: Judgment normal.     Comments: Insight intact     Lab Review:     Component Value Date/Time   NA 138 05/09/2019 1329   K 3.9 05/09/2019 1329   CL 103 05/09/2019 1329   CO2 24 05/09/2019 1329   GLUCOSE 90 05/09/2019 1329   BUN 10 05/09/2019 1329   CREATININE 1.23 05/09/2019 1329   CALCIUM 9.6 05/09/2019 1329   PROT 7.4 04/08/2019 1836   ALBUMIN 4.6 04/08/2019 1836   AST 29 04/08/2019 1836   ALT 57 (H) 04/08/2019 1836   ALKPHOS 51 04/08/2019 1836   BILITOT 0.7 04/08/2019 1836   GFRNONAA >60 05/09/2019 1329   GFRAA >60 05/09/2019 1329       Component Value Date/Time   WBC 5.4 05/09/2019 1329   RBC 5.57 05/09/2019 1329   HGB 16.1 05/09/2019 1329   HCT 49.2 05/09/2019 1329   PLT 297 05/09/2019 1329   MCV 88.3 05/09/2019 1329   MCH 28.9 05/09/2019 1329   MCHC 32.7 05/09/2019 1329   RDW 13.2 05/09/2019 1329   LYMPHSABS 1.1 04/08/2019 1836   MONOABS 0.6 04/08/2019 1836   EOSABS 0.0 04/08/2019 1836   BASOSABS 0.0 04/08/2019 1836    No results found for: "POCLITH", "LITHIUM"   No results found for: "PHENYTOIN", "PHENOBARB", "VALPROATE", "CBMZ"   .res Assessment: Plan:    Plan:  PDMP reviewed  1. Vyvanse 50mg  daily 2. Gabapentin 200mg  BID 3. Clonazepam 0.5mg  TID 4. Lexapro 20mg  daily 5. Adderall 10mg  daily as needed  Monitor BP between visits while taking stimulant  medication.   RTC 3 months  Patient advised to  contact office with any questions, adverse effects, or acute worsening in signs and symptoms.  Discussed potential benefits, risk, and side effects of benzodiazepines to include potential risk of tolerance and dependence, as well as possible drowsiness.  Advised patient not to drive if experiencing drowsiness and to take lowest possible effective dose to minimize risk of dependence and tolerance.  Discussed potential benefits, risks, and side effects of stimulants with patient to include increased heart rate, palpitations, insomnia, increased anxiety, increased irritability, or decreased appetite.  Instructed patient to contact office if experiencing any significant tolerability issues.  Diagnoses and all orders for this visit:  Restless legs syndrome -     amphetamine-dextroamphetamine (ADDERALL) 10 MG tablet; Take 1 tablet (10 mg total) by mouth daily. -     amphetamine-dextroamphetamine (ADDERALL) 10 MG tablet; Take 1 tablet (10 mg total) by mouth daily. -     gabapentin (NEURONTIN) 100 MG capsule; Take 2 capsules (200 mg total) by mouth 2 (two) times daily.  Attention deficit hyperactivity disorder (ADHD), combined type, moderate -     amphetamine-dextroamphetamine (ADDERALL) 10 MG tablet; Take 1 tablet (10 mg total) by mouth daily. -     lisdexamfetamine (VYVANSE) 50 MG capsule; Take 1 capsule (50 mg total) by mouth daily after breakfast. -     lisdexamfetamine (VYVANSE) 50 MG capsule; Take 1 capsule (50 mg total) by mouth daily after breakfast. -     lisdexamfetamine (VYVANSE) 50 MG capsule; Take 1 capsule (50 mg total) by mouth daily after breakfast.  Generalized anxiety disorder -     clonazePAM (KLONOPIN) 0.5 MG tablet; Take 1 tablet (0.5 mg total) by mouth 3 (three) times daily as needed for anxiety (Panic attack). -     escitalopram (LEXAPRO) 20 MG tablet; Take 1 tablet (20 mg total) by mouth daily.  Panic disorder -     clonazePAM  (KLONOPIN) 0.5 MG tablet; Take 1 tablet (0.5 mg total) by mouth 3 (three) times daily as needed for anxiety (Panic attack). -     escitalopram (LEXAPRO) 20 MG tablet; Take 1 tablet (20 mg total) by mouth daily. -     gabapentin (NEURONTIN) 100 MG capsule; Take 2 capsules (200 mg total) by mouth 2 (two) times daily.  Persistent depressive disorder with atypical features, currently mild -     escitalopram (LEXAPRO) 20 MG tablet; Take 1 tablet (20 mg total) by mouth daily.     Please see After Visit Summary for patient specific instructions.  No future appointments.   No orders of the defined types were placed in this encounter.     -------------------------------

## 2023-04-26 ENCOUNTER — Telehealth (INDEPENDENT_AMBULATORY_CARE_PROVIDER_SITE_OTHER): Payer: Commercial Managed Care - PPO | Admitting: Adult Health

## 2023-04-26 ENCOUNTER — Encounter: Payer: Self-pay | Admitting: Adult Health

## 2023-04-26 DIAGNOSIS — F410 Panic disorder [episodic paroxysmal anxiety] without agoraphobia: Secondary | ICD-10-CM | POA: Diagnosis not present

## 2023-04-26 DIAGNOSIS — F902 Attention-deficit hyperactivity disorder, combined type: Secondary | ICD-10-CM | POA: Diagnosis not present

## 2023-04-26 DIAGNOSIS — F341 Dysthymic disorder: Secondary | ICD-10-CM | POA: Diagnosis not present

## 2023-04-26 DIAGNOSIS — F411 Generalized anxiety disorder: Secondary | ICD-10-CM

## 2023-04-26 MED ORDER — ALPRAZOLAM 1 MG PO TABS
1.0000 mg | ORAL_TABLET | Freq: Two times a day (BID) | ORAL | 2 refills | Status: DC | PRN
Start: 2023-04-26 — End: 2023-06-14

## 2023-04-26 NOTE — Addendum Note (Signed)
Addended by: Dorothyann Gibbs on: 04/26/2023 09:28 AM   Modules accepted: Level of Service

## 2023-04-26 NOTE — Progress Notes (Signed)
MOHANNAD Sherman [id number] June 21, 2001 22 y.o.  Virtual Visit via Video Note  I connected with pt @ on 04/26/23 at  9:00 AM EDT by a video enabled telemedicine application and verified that I am speaking with the correct person using two identifiers.   I discussed the limitations of evaluation and management by telemedicine and the availability of in person appointments. The patient expressed understanding and agreed to proceed.  I discussed the assessment and treatment plan with the patient. The patient was provided an opportunity to ask questions and all were answered. The patient agreed with the plan and demonstrated an understanding of the instructions.   The patient was advised to call back or seek an in-person evaluation if the symptoms worsen or if the condition fails to improve as anticipated.  I provided 25 minutes of non-face-to-face time during this encounter.  The patient was located at home.  The provider was located at [facility].   Dorothyann Gibbs, NP   Subjective:   Patient ID:  Lucas Sherman is a 22 y.o. (DOB 05-17-2001) male.  Chief Complaint: No chief complaint on file.   HPI MANDIP FRAME presents for follow-up of ADHD, Panic disorder, RLS, Depression, GAD.  Describes mood today as "ok". Pleasant. Denies tearfulness. Mood symptoms - reports increased anxiety. Reports intrusive thoughts. Reports 2 to 3 panic attacks a week. Denies depression. Reports irritability at times. Reports some worry, rumination, and over thinking at times. Mood is consistent. Stating "I'm struggling with anxiety and I'm doing everything I know to do to help myself". Feels like medications are helpful, but is struggling. Willing to consider. Stable interest and motivation. Taking medications as presribed.  Energy levels stable. Active, has a regular exercise routine.   Enjoys some usual interests and activities. Single. Has a girlfriend. Spending time with family. Appetite  adequate. Weight stable - 177 pounds - 68". Sleeps well most nights. Averages 7 to 9 hours. Reports focus and concentration difficulties at times. Completing tasks. Managing aspects of household. Taking college classes. Playing baseball - [employer]. Denies SI or HI.  Denies AH or VH. Denies self harm. Denies substance use.   Previous medication trials: Denies   Review of Systems:  Review of Systems  Musculoskeletal:  Negative for gait problem.  Neurological:  Negative for tremors.  Psychiatric/Behavioral:         Please refer to HPI    Medications: I have reviewed the patient's current medications.  Current Outpatient Medications  Medication Sig Dispense Refill   amphetamine-dextroamphetamine (ADDERALL) 10 MG tablet Take 1 tablet (10 mg total) by mouth daily. 30 tablet 0   amphetamine-dextroamphetamine (ADDERALL) 10 MG tablet Take 1 tablet (10 mg total) by mouth daily. 30 tablet 0   [START ON 05/24/2023] amphetamine-dextroamphetamine (ADDERALL) 10 MG tablet Take 1 tablet (10 mg total) by mouth daily. 30 tablet 0   clonazePAM (KLONOPIN) 0.5 MG tablet Take 1 tablet (0.5 mg total) by mouth 3 (three) times daily as needed for anxiety (Panic attack). 90 tablet 2   escitalopram (LEXAPRO) 20 MG tablet Take 1 tablet (20 mg total) by mouth daily. 90 tablet 1   gabapentin (NEURONTIN) 100 MG capsule Take 2 capsules (200 mg total) by mouth 2 (two) times daily. 120 capsule 5   lisdexamfetamine (VYVANSE) 50 MG capsule Take 1 capsule (50 mg total) by mouth daily after breakfast. 30 capsule 0   lisdexamfetamine (VYVANSE) 50 MG capsule Take 1 capsule (50 mg total) by mouth daily after breakfast.  30 capsule 0   [START ON 05/24/2023] lisdexamfetamine (VYVANSE) 50 MG capsule Take 1 capsule (50 mg total) by mouth daily after breakfast. 30 capsule 0   No current facility-administered medications for this visit.    Medication Side Effects: None  Allergies:  Allergies  Allergen Reactions    Reglan [Metoclopramide]     "anxiety"    Past Medical History:  Diagnosis Date   Anxiety    Attention deficit hyperactivity disorder (ADHD), combined type, moderate 04/12/2020   COVID-19    Persistent depressive disorder with atypical features, currently mild 03/08/2020   Persistent depressive disorder with atypical features, currently mild 03/08/2020    Family History  Problem Relation Age of Onset   Depression Mother    Anxiety disorder Mother    Depression Maternal Grandmother    Anxiety disorder Brother    Panic disorder Brother    Depression Other    Alcohol abuse Other    Arthritis Neg Hx    Asthma Neg Hx    Birth defects Neg Hx    Cancer Neg Hx    COPD Neg Hx    Diabetes Neg Hx    Drug abuse Neg Hx    Early death Neg Hx    Hearing loss Neg Hx    Heart disease Neg Hx    Hyperlipidemia Neg Hx    Learning disabilities Neg Hx    Kidney disease Neg Hx    Hypertension Neg Hx    Mental illness Neg Hx    Miscarriages / Stillbirths Neg Hx    Mental retardation Neg Hx    Stroke Neg Hx    Vision loss Neg Hx    Varicose Veins Neg Hx     Social History   Socioeconomic History   Marital status: Single    Spouse name: Not on file   Number of children: Not on file   Years of education: 14   Highest education level: High school graduate  Occupational History   Occupation: Seeking pro baseball.career  Tobacco Use   Smoking status: Never   Smokeless tobacco: Former  Building services engineer status: Never Used  Substance and Sexual Activity   Alcohol use: Not Currently    Comment: occasional   Drug use: Not Currently    Types: Marijuana   Sexual activity: Not on file  Other Topics Concern   Not on file  Social History Narrative   Scientist, 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  He describes restless legs in his sleep patterns having to get up for a hot bath or stretches to his legs in order to try to get back to sleep.  Despite longstanding Lexapro and new onset Klonopin, he fears and finds instability still in his illness pattern that undermines his performance in baseball and academics.  He leaves for college 05/18/2019 seeking intervention prior to then after PCP has recently started the Klonopin which helped but affecting tolerance side effects if dosed higher to prevent anxiety or panic.   Social Determinants of Health   Financial Resource Strain: Not on file  Food Insecurity: No Food Insecurity (05/04/2019)   Hunger Vital Sign    Worried About Running Out of  Food in the Last Year: Never true    Ran Out of Food in the Last Year: Never true  Transportation Needs: No Transportation Needs (05/04/2019)   PRAPARE - Administrator, Civil Service (Medical): No    Lack of Transportation (Non-Medical): No  Physical Activity: Not on file  Stress: Stress Concern Present (05/04/2019)   [employer] of Occupational Health - Occupational Stress Questionnaire    Feeling of Stress : Rather much  Social Connections: Unknown (11/08/2022)   [facility] from [facility], [facility]   Social Network    Social Network: Not on file  Intimate Partner Violence: Unknown (11/08/2022)   Received from [facility], [facility]   HITS    Physically Hurt: Not on file    Insult or Talk Down To: Not on file    Threaten Physical Harm: Not on file    Scream or Curse: Not on file    Past Medical History, Surgical history, Social history, and Family history were reviewed and updated as appropriate.   Please see review of systems for further details on the patient's review from today.   Objective:   Physical Exam:   There were no vitals taken for this visit.  Physical Exam Constitutional:      General: He is not in acute distress. Musculoskeletal:        General: No deformity.  Neurological:     Mental Status: He is alert and oriented to person, place, and time.     Coordination: Coordination normal.  Psychiatric:        Attention and Perception: Attention and perception normal. He does not perceive auditory or visual hallucinations.        Mood and Affect: Affect is not labile, blunt, angry or inappropriate.        Speech: Speech normal.        Behavior: Behavior normal.        Thought Content: Thought content normal. Thought content is not paranoid or delusional. Thought content does not include homicidal or suicidal ideation. Thought content does not include homicidal or suicidal plan.        Cognition and Memory: Cognition and memory normal.        Judgment: Judgment normal.     Comments: Insight intact     Lab Review:     Component Value Date/Time   NA 138 05/09/2019 1329   K 3.9 05/09/2019 1329   CL 103 05/09/2019 1329   CO2 24 05/09/2019 1329   GLUCOSE 90 05/09/2019 1329   BUN 10 05/09/2019 1329   CREATININE 1.23 05/09/2019 1329   CALCIUM 9.6 05/09/2019 1329   PROT 7.4 04/08/2019 1836   ALBUMIN 4.6 04/08/2019 1836   AST 29 04/08/2019 1836   ALT 57 (H) 04/08/2019 1836   ALKPHOS 51 04/08/2019 1836   BILITOT 0.7 04/08/2019 1836   GFRNONAA >60 05/09/2019 1329   GFRAA >60 05/09/2019 1329       Component Value Date/Time   WBC 5.4 05/09/2019 1329   RBC 5.57 05/09/2019 1329   HGB 16.1 05/09/2019 1329   HCT 49.2 05/09/2019 1329   PLT 297 05/09/2019 1329   MCV 88.3 05/09/2019 1329   MCH 28.9 05/09/2019 1329   MCHC 32.7 05/09/2019 1329   RDW 13.2 05/09/2019 1329   LYMPHSABS 1.1 04/08/2019 1836   MONOABS 0.6 04/08/2019 1836   EOSABS 0.0 04/08/2019 1836   BASOSABS 0.0 04/08/2019 1836    No results found for: "POCLITH", "LITHIUM"  No results found for: "PHENYTOIN",  "PHENOBARB", "VALPROATE", "CBMZ"   .res Assessment: Plan:    Plan:  PDMP reviewed  Vyvanse 50mg  daily Gabapentin 200mg  BID to TID Lexapro 20mg  daily Adderall 10mg  daily as needed  D/C Clonazepam 0.5mg  TID Add Xanax 1mg  BID  Monitor BP between visits while taking stimulant medication.   RTC 4 weeks   Patient advised to contact office with any questions, adverse effects, or acute worsening in signs and symptoms.  Discussed potential benefits, risk, and side effects of benzodiazepines to include potential risk of tolerance and dependence, as well as possible drowsiness.  Advised patient not to drive if experiencing drowsiness and to take lowest possible effective dose to minimize risk of dependence and tolerance.  Discussed potential benefits, risks, and side effects of stimulants with patient to include increased heart rate, palpitations, insomnia, increased anxiety, increased irritability, or decreased appetite.  Instructed patient to contact office if experiencing any significant tolerability issues.  There are no diagnoses linked to this encounter.   Please see After Visit Summary for patient specific instructions.  Future Appointments  Date Time Provider Department Center  04/26/2023  9:00 AM Maliki Gignac, Thereasa Solo, NP [facility] None    No orders of the defined types were placed in this encounter.     -------------------------------

## 2023-05-02 ENCOUNTER — Telehealth: Payer: Self-pay | Admitting: Adult Health

## 2023-05-02 ENCOUNTER — Other Ambulatory Visit (HOSPITAL_BASED_OUTPATIENT_CLINIC_OR_DEPARTMENT_OTHER): Payer: Self-pay

## 2023-05-02 ENCOUNTER — Other Ambulatory Visit: Payer: Self-pay

## 2023-05-02 DIAGNOSIS — F902 Attention-deficit hyperactivity disorder, combined type: Secondary | ICD-10-CM

## 2023-05-02 MED ORDER — LISDEXAMFETAMINE DIMESYLATE 50 MG PO CAPS
50.0000 mg | ORAL_CAPSULE | Freq: Every day | ORAL | 0 refills | Status: DC
Start: 2023-05-02 — End: 2023-06-14
  Filled 2023-05-02: qty 30, 30d supply, fill #0

## 2023-05-02 NOTE — Telephone Encounter (Signed)
Pt called at 2:15p requesting refill of Vyvanse to [facility].  He said it is not available at the [facility] in [place] that it was originally sent to.

## 2023-05-02 NOTE — Telephone Encounter (Signed)
Pended.

## 2023-06-04 ENCOUNTER — Other Ambulatory Visit (HOSPITAL_BASED_OUTPATIENT_CLINIC_OR_DEPARTMENT_OTHER): Payer: Self-pay

## 2023-06-04 ENCOUNTER — Telehealth: Payer: Self-pay | Admitting: Adult Health

## 2023-06-04 ENCOUNTER — Other Ambulatory Visit: Payer: Self-pay | Admitting: Adult Health

## 2023-06-04 ENCOUNTER — Other Ambulatory Visit: Payer: Self-pay

## 2023-06-04 DIAGNOSIS — F902 Attention-deficit hyperactivity disorder, combined type: Secondary | ICD-10-CM

## 2023-06-04 DIAGNOSIS — G2581 Restless legs syndrome: Secondary | ICD-10-CM

## 2023-06-04 DIAGNOSIS — F410 Panic disorder [episodic paroxysmal anxiety] without agoraphobia: Secondary | ICD-10-CM

## 2023-06-04 MED ORDER — LISDEXAMFETAMINE DIMESYLATE 50 MG PO CAPS
50.0000 mg | ORAL_CAPSULE | Freq: Every day | ORAL | 0 refills | Status: DC
Start: 2023-06-04 — End: 2023-06-14
  Filled 2023-06-04: qty 30, 30d supply, fill #0

## 2023-06-04 NOTE — Telephone Encounter (Signed)
Patient has refills available of gabapentin, pharmacy is filling today.

## 2023-06-04 NOTE — Telephone Encounter (Signed)
Pt called at 1:30p.  He said he got a text that he had 0 refills on the Gabapentin, even though I show he had 5 refills on the original script.  He's asking for it to be sent in to t  [id number] - [place], [place] - [address] [place] [address] Phone: [phone]  Fax: [phone]   He said none of the local pharmacies in [place] have the Vyvanse, so he is asking for the refill for that to be sent to  [facility] [address] [place] [address] Phone: [phone]  Fax: [phone]   It is available there.  Next appt 9/5

## 2023-06-04 NOTE — Telephone Encounter (Signed)
Pended Vyvanse 

## 2023-06-06 ENCOUNTER — Telehealth (INDEPENDENT_AMBULATORY_CARE_PROVIDER_SITE_OTHER): Payer: Self-pay | Admitting: Adult Health

## 2023-06-06 DIAGNOSIS — Z0389 Encounter for observation for other suspected diseases and conditions ruled out: Secondary | ICD-10-CM

## 2023-06-06 NOTE — Progress Notes (Signed)
Patient no show appointment. ? ?

## 2023-06-14 ENCOUNTER — Other Ambulatory Visit (HOSPITAL_BASED_OUTPATIENT_CLINIC_OR_DEPARTMENT_OTHER): Payer: Self-pay

## 2023-06-14 ENCOUNTER — Encounter: Payer: Self-pay | Admitting: Adult Health

## 2023-06-14 ENCOUNTER — Telehealth (INDEPENDENT_AMBULATORY_CARE_PROVIDER_SITE_OTHER): Payer: Commercial Managed Care - PPO | Admitting: Adult Health

## 2023-06-14 DIAGNOSIS — F902 Attention-deficit hyperactivity disorder, combined type: Secondary | ICD-10-CM

## 2023-06-14 DIAGNOSIS — F410 Panic disorder [episodic paroxysmal anxiety] without agoraphobia: Secondary | ICD-10-CM

## 2023-06-14 DIAGNOSIS — G2581 Restless legs syndrome: Secondary | ICD-10-CM | POA: Diagnosis not present

## 2023-06-14 DIAGNOSIS — F411 Generalized anxiety disorder: Secondary | ICD-10-CM

## 2023-06-14 DIAGNOSIS — F341 Dysthymic disorder: Secondary | ICD-10-CM

## 2023-06-14 DIAGNOSIS — F909 Attention-deficit hyperactivity disorder, unspecified type: Secondary | ICD-10-CM

## 2023-06-14 MED ORDER — GABAPENTIN 100 MG PO CAPS: 200.0000 mg | ORAL_CAPSULE | Freq: Two times a day (BID) | ORAL | 1 refills | Status: DC

## 2023-06-14 MED ORDER — LISDEXAMFETAMINE DIMESYLATE 50 MG PO CAPS
50.0000 mg | ORAL_CAPSULE | Freq: Every day | ORAL | 0 refills | Status: DC
  Filled 2023-06-14 – 2023-07-03 (×2): qty 30, 30d supply, fill #0

## 2023-06-14 MED ORDER — AMPHETAMINE-DEXTROAMPHETAMINE 10 MG PO TABS
10.0000 mg | ORAL_TABLET | Freq: Every day | ORAL | 0 refills | Status: DC
Start: 2023-08-09 — End: 2023-09-11

## 2023-06-14 MED ORDER — LISDEXAMFETAMINE DIMESYLATE 50 MG PO CAPS
50.0000 mg | ORAL_CAPSULE | Freq: Every day | ORAL | 0 refills | Status: DC
  Filled 2023-08-09: qty 30, 30d supply, fill #0

## 2023-06-14 MED ORDER — ESCITALOPRAM OXALATE 20 MG PO TABS
20.0000 mg | ORAL_TABLET | Freq: Every day | ORAL | 1 refills | Status: DC
Start: 2023-06-14 — End: 2023-09-11

## 2023-06-14 MED ORDER — AMPHETAMINE-DEXTROAMPHETAMINE 10 MG PO TABS
10.0000 mg | ORAL_TABLET | Freq: Every day | ORAL | 0 refills | Status: DC
Start: 2023-06-14 — End: 2023-09-11

## 2023-06-14 MED ORDER — AMPHETAMINE-DEXTROAMPHETAMINE 10 MG PO TABS
10.0000 mg | ORAL_TABLET | Freq: Every day | ORAL | 0 refills | Status: DC
Start: 2023-07-12 — End: 2023-09-11

## 2023-06-14 MED ORDER — LISDEXAMFETAMINE DIMESYLATE 50 MG PO CAPS
50.0000 mg | ORAL_CAPSULE | Freq: Every day | ORAL | 0 refills | Status: DC
  Filled 2023-09-09: qty 30, 30d supply, fill #0

## 2023-06-14 MED ORDER — CLONAZEPAM 1 MG PO TABS
1.0000 mg | ORAL_TABLET | Freq: Two times a day (BID) | ORAL | 2 refills | Status: DC
Start: 2023-06-14 — End: 2023-09-05

## 2023-06-14 NOTE — Progress Notes (Signed)
Lucas Sherman [id number] 03/08/2001 22 y.o.  Virtual Visit via Video Note  I connected with pt @ on 06/14/23 at  1:40 PM EDT by a video enabled telemedicine application and verified that I am speaking with the correct person using two identifiers.   I discussed the limitations of evaluation and management by telemedicine and the availability of in person appointments. The patient expressed understanding and agreed to proceed.  I discussed the assessment and treatment plan with the patient. The patient was provided an opportunity to ask questions and all were answered. The patient agreed with the plan and demonstrated an understanding of the instructions.   The patient was advised to call back or seek an in-person evaluation if the symptoms worsen or if the condition fails to improve as anticipated.  I provided 25 minutes of non-face-to-face time during this encounter.  The patient was located at home.  The provider was located at [facility].   Lucas Gibbs, NP   Subjective:   Patient ID:  Lucas Sherman is a 22 y.o. (DOB [birth year]) male.  Chief Complaint: No chief complaint on file.   HPI Lucas Sherman presents for follow-up of ADHD, Panic disorder, RLS, Depression, GAD.  Describes mood today as "ok". Pleasant. Denies tearfulness. Mood symptoms - reports anxiety. Reports panic attacks. Reports feelings of impending doom. Does not feel like the switch to Xanax from Clonazepam was helpful and would lie to restart the Clonazepam. Denies depression, but feels anxiety has him "trapped". Reports irritability at times. Reports some worry, rumination, and over thinking at times. Mood is consistent. Stating "I feel like I'm functional". Feels like medications are helpful. Family supportive. Stable interest and motivation. Taking medications as presribed.  Energy levels stable. Active, has a regular exercise routine.   Enjoys some usual interests and activities. Dating. Has a  girlfriend. Spending time with family. Appetite adequate. Weight stable - 177 pounds - 68". Sleeps well most nights. Averages 7 to 9 hours. Reports focus and concentration difficulties at times. Completing tasks. Managing aspects of household. Taking college classes. Playing baseball - [employer]. Denies SI or HI.  Denies AH or VH. Denies self harm. Denies substance use.   Previous medication trials: Denies    Review of Systems:  Review of Systems  Musculoskeletal:  Negative for gait problem.  Neurological:  Negative for tremors.  Psychiatric/Behavioral:         Please refer to HPI    Medications: I have reviewed the patient's current medications.  Current Outpatient Medications  Medication Sig Dispense Refill   ALPRAZolam (XANAX) 1 MG tablet Take 1 tablet (1 mg total) by mouth 2 (two) times daily as needed for anxiety. 60 tablet 2   amphetamine-dextroamphetamine (ADDERALL) 10 MG tablet Take 1 tablet (10 mg total) by mouth daily. 30 tablet 0   amphetamine-dextroamphetamine (ADDERALL) 10 MG tablet Take 1 tablet (10 mg total) by mouth daily. 30 tablet 0   amphetamine-dextroamphetamine (ADDERALL) 10 MG tablet Take 1 tablet (10 mg total) by mouth daily. 30 tablet 0   escitalopram (LEXAPRO) 20 MG tablet Take 1 tablet (20 mg total) by mouth daily. 90 tablet 1   gabapentin (NEURONTIN) 100 MG capsule Take 2 capsules (200 mg total) by mouth 2 (two) times daily. 120 capsule 5   lisdexamfetamine (VYVANSE) 50 MG capsule Take 1 capsule (50 mg total) by mouth daily after breakfast. 30 capsule 0   lisdexamfetamine (VYVANSE) 50 MG capsule Take 1 capsule (50 mg total) by mouth  daily after breakfast. 30 capsule 0   lisdexamfetamine (VYVANSE) 50 MG capsule Take 1 capsule (50 mg total) by mouth daily after breakfast. 30 capsule 0   No current facility-administered medications for this visit.    Medication Side Effects: None  Allergies:  Allergies  Allergen Reactions   Reglan  [Metoclopramide]     "anxiety"    Past Medical History:  Diagnosis Date   Anxiety    Attention deficit hyperactivity disorder (ADHD), combined type, moderate 04/12/2020   COVID-19    Persistent depressive disorder with atypical features, currently mild 03/08/2020   Persistent depressive disorder with atypical features, currently mild 03/08/2020    Family History  Problem Relation Age of Onset   Depression Mother    Anxiety disorder Mother    Depression Maternal Grandmother    Anxiety disorder Brother    Panic disorder Brother    Depression Other    Alcohol abuse Other    Arthritis Neg Hx    Asthma Neg Hx    Birth defects Neg Hx    Cancer Neg Hx    COPD Neg Hx    Diabetes Neg Hx    Drug abuse Neg Hx    Early death Neg Hx    Hearing loss Neg Hx    Heart disease Neg Hx    Hyperlipidemia Neg Hx    Learning disabilities Neg Hx    Kidney disease Neg Hx    Hypertension Neg Hx    Mental illness Neg Hx    Miscarriages / Stillbirths Neg Hx    Mental retardation Neg Hx    Stroke Neg Hx    Vision loss Neg Hx    Varicose Veins Neg Hx     Social History   Socioeconomic History   Marital status: Single    Spouse name: Not on file   Number of children: Not on file   Years of education: 14   Highest education level: High school graduate  Occupational History   Occupation: Seeking pro baseball.career  Tobacco Use   Smoking status: Never   Smokeless tobacco: Former  Building services engineer status: Never Used  Substance and Sexual Activity   Alcohol use: Not Currently    Comment: occasional   Drug use: Not Currently    Types: Marijuana   Sexual activity: Not on file  Other Topics Concern   Not on file  Social History Narrative   Scientist, 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  He describes restless legs in his sleep patterns having to get up for a hot bath or stretches to his legs in order to try to get back to sleep.  Despite longstanding Lexapro and new onset Klonopin, he fears and finds instability still in his illness pattern that undermines his performance in baseball and academics.  He leaves for college 05/18/2019 seeking intervention prior to then after PCP has recently started the Klonopin which helped but affecting tolerance side effects if dosed higher to prevent anxiety or panic.   Social Determinants of Health   Financial Resource Strain: Not on file  Food Insecurity: No Food Insecurity (05/04/2019)   Hunger Vital Sign    Worried About Running Out of  Food in the Last Year: Never true    Ran Out of Food in the Last Year: Never true  Transportation Needs: No Transportation Needs (05/04/2019)   PRAPARE - Administrator, Civil Service (Medical): No    Lack of Transportation (Non-Medical): No  Physical Activity: Not on file  Stress: Stress Concern Present (05/04/2019)   [employer] of Occupational Health - Occupational Stress Questionnaire    Feeling of Stress : Rather much  Social Connections: Unknown (11/08/2022)   Received from [facility]   Social Network    Social Network: Not on file  Intimate Partner Violence: Unknown (11/08/2022)   Received from [facility], [facility]   HITS    Physically Hurt: Not on file    Insult or Talk Down To: Not on file    Threaten Physical Harm: Not on file    Scream or Curse: Not on file    Past Medical History, Surgical history, Social history, and Family history were reviewed and updated as appropriate.   Please see review of systems for further details on the patient's review from today.   Objective:   Physical Exam:  There  were no vitals taken for this visit.  Physical Exam Constitutional:      General: He is not in acute distress. Musculoskeletal:        General: No deformity.  Neurological:     Mental Status: He is alert and oriented to person, place, and time.     Coordination: Coordination normal.  Psychiatric:        Attention and Perception: Attention and perception normal. He does not perceive auditory or visual hallucinations.        Mood and Affect: Affect is not labile, blunt, angry or inappropriate.        Speech: Speech normal.        Behavior: Behavior normal.        Thought Content: Thought content normal. Thought content is not paranoid or delusional. Thought content does not include homicidal or suicidal ideation. Thought content does not include homicidal or suicidal plan.        Cognition and Memory: Cognition and memory normal.        Judgment: Judgment normal.     Comments: Insight intact     Lab Review:     Component Value Date/Time   NA 138 05/09/2019 1329   K 3.9 05/09/2019 1329   CL 103 05/09/2019 1329   CO2 24 05/09/2019 1329   GLUCOSE 90 05/09/2019 1329   BUN 10 05/09/2019 1329   CREATININE 1.23 05/09/2019 1329   CALCIUM 9.6 05/09/2019 1329   PROT 7.4 04/08/2019 1836   ALBUMIN 4.6 04/08/2019 1836   AST 29 04/08/2019 1836   ALT 57 (H) 04/08/2019 1836   ALKPHOS 51 04/08/2019 1836   BILITOT 0.7 04/08/2019 1836   GFRNONAA >60 05/09/2019 1329   GFRAA >60 05/09/2019 1329       Component Value Date/Time   WBC 5.4 05/09/2019 1329   RBC 5.57 05/09/2019 1329   HGB 16.1 05/09/2019 1329   HCT 49.2 05/09/2019 1329   PLT 297 05/09/2019 1329   MCV 88.3 05/09/2019 1329   MCH 28.9 05/09/2019 1329   MCHC 32.7 05/09/2019 1329   RDW 13.2 05/09/2019 1329   LYMPHSABS 1.1 04/08/2019 1836   MONOABS 0.6 04/08/2019 1836   EOSABS 0.0 04/08/2019 1836   BASOSABS 0.0 04/08/2019 1836    No results found for: "POCLITH", "LITHIUM"  No results found for: "PHENYTOIN", "PHENOBARB",  "VALPROATE", "CBMZ"   .res Assessment: Plan:    Plan:  PDMP reviewed  Vyvanse 50mg  daily Gabapentin 200mg  BID Lexapro 20mg  daily Adderall 10mg  daily as needed  Restart Clonazepam 0.5mg  TID to 1mg  BID  D/C Xanax 1mg  BID  Monitor BP between visits while taking stimulant medication.   RTC 4 weeks   Patient advised to contact office with any questions, adverse effects, or acute worsening in signs and symptoms.  Discussed potential benefits, risk, and side effects of benzodiazepines to include potential risk of tolerance and dependence, as well as possible drowsiness.  Advised patient not to drive if experiencing drowsiness and to take lowest possible effective dose to minimize risk of dependence and tolerance.  Discussed potential benefits, risks, and side effects of stimulants with patient to include increased heart rate, palpitations, insomnia, increased anxiety, increased irritability, or decreased appetite.  Instructed patient to contact office if experiencing any significant tolerability issues.  There are no diagnoses linked to this encounter.   Please see After Visit Summary for patient specific instructions.  Future Appointments  Date Time Provider Department Center  06/14/2023  1:40 PM Beadie Matsunaga, Thereasa Solo, NP CP-CP None    No orders of the defined types were placed in this encounter.     -------------------------------

## 2023-07-02 ENCOUNTER — Telehealth: Payer: Self-pay | Admitting: Adult Health

## 2023-07-02 NOTE — Telephone Encounter (Signed)
Patient has RF at the requested pharmacy.

## 2023-07-02 NOTE — Telephone Encounter (Signed)
Lucas Sherman called at 4:20 to request refill of Vyvanse 50 mg.  He prefers Brand.  Send to  [facility]     They said they have 38 of the 50mg  Brands but also have generic if needed.  Next appt 12/11

## 2023-07-03 ENCOUNTER — Other Ambulatory Visit (HOSPITAL_BASED_OUTPATIENT_CLINIC_OR_DEPARTMENT_OTHER): Payer: Self-pay

## 2023-07-03 NOTE — Telephone Encounter (Signed)
Patient notified that he had a RF already available at the requested pharmacy.

## 2023-08-06 ENCOUNTER — Other Ambulatory Visit (HOSPITAL_BASED_OUTPATIENT_CLINIC_OR_DEPARTMENT_OTHER): Payer: Self-pay

## 2023-08-08 ENCOUNTER — Other Ambulatory Visit (HOSPITAL_BASED_OUTPATIENT_CLINIC_OR_DEPARTMENT_OTHER): Payer: Self-pay

## 2023-08-09 ENCOUNTER — Other Ambulatory Visit: Payer: Self-pay

## 2023-09-04 ENCOUNTER — Other Ambulatory Visit: Payer: Self-pay | Admitting: Adult Health

## 2023-09-04 DIAGNOSIS — F411 Generalized anxiety disorder: Secondary | ICD-10-CM

## 2023-09-06 ENCOUNTER — Telehealth: Payer: Self-pay | Admitting: Adult Health

## 2023-09-06 NOTE — Telephone Encounter (Signed)
error 

## 2023-09-09 ENCOUNTER — Other Ambulatory Visit (HOSPITAL_BASED_OUTPATIENT_CLINIC_OR_DEPARTMENT_OTHER): Payer: Self-pay

## 2023-09-11 ENCOUNTER — Encounter: Payer: Self-pay | Admitting: Adult Health

## 2023-09-11 ENCOUNTER — Telehealth: Payer: Commercial Managed Care - PPO | Admitting: Adult Health

## 2023-09-11 ENCOUNTER — Other Ambulatory Visit (HOSPITAL_BASED_OUTPATIENT_CLINIC_OR_DEPARTMENT_OTHER): Payer: Self-pay

## 2023-09-11 DIAGNOSIS — F410 Panic disorder [episodic paroxysmal anxiety] without agoraphobia: Secondary | ICD-10-CM | POA: Diagnosis not present

## 2023-09-11 DIAGNOSIS — F902 Attention-deficit hyperactivity disorder, combined type: Secondary | ICD-10-CM | POA: Diagnosis not present

## 2023-09-11 DIAGNOSIS — F411 Generalized anxiety disorder: Secondary | ICD-10-CM

## 2023-09-11 DIAGNOSIS — G2581 Restless legs syndrome: Secondary | ICD-10-CM

## 2023-09-11 DIAGNOSIS — F341 Dysthymic disorder: Secondary | ICD-10-CM

## 2023-09-11 MED ORDER — LISDEXAMFETAMINE DIMESYLATE 50 MG PO CAPS
50.0000 mg | ORAL_CAPSULE | Freq: Every day | ORAL | 0 refills | Status: DC
  Filled 2023-10-15: qty 30, 30d supply, fill #0

## 2023-09-11 MED ORDER — AMPHETAMINE-DEXTROAMPHETAMINE 10 MG PO TABS
10.0000 mg | ORAL_TABLET | Freq: Every day | ORAL | 0 refills | Status: DC
Start: 2023-11-06 — End: 2024-01-10

## 2023-09-11 MED ORDER — LISDEXAMFETAMINE DIMESYLATE 50 MG PO CAPS
50.0000 mg | ORAL_CAPSULE | Freq: Every day | ORAL | 0 refills | Status: DC
  Filled 2023-09-11 – 2023-11-13 (×2): qty 30, 30d supply, fill #0

## 2023-09-11 MED ORDER — LISDEXAMFETAMINE DIMESYLATE 50 MG PO CAPS
50.0000 mg | ORAL_CAPSULE | Freq: Every day | ORAL | 0 refills | Status: DC
  Filled 2023-12-13: qty 30, 30d supply, fill #0

## 2023-09-11 MED ORDER — ESCITALOPRAM OXALATE 20 MG PO TABS: 20.0000 mg | ORAL_TABLET | Freq: Every day | ORAL | 1 refills | Status: DC

## 2023-09-11 MED ORDER — CLONAZEPAM 1 MG PO TABS
1.0000 mg | ORAL_TABLET | Freq: Two times a day (BID) | ORAL | 2 refills | Status: DC
Start: 2023-09-11 — End: 2024-01-10

## 2023-09-11 MED ORDER — AMPHETAMINE-DEXTROAMPHETAMINE 10 MG PO TABS
10.0000 mg | ORAL_TABLET | Freq: Every day | ORAL | 0 refills | Status: DC
Start: 2023-10-09 — End: 2024-01-10

## 2023-09-11 MED ORDER — AMPHETAMINE-DEXTROAMPHETAMINE 10 MG PO TABS
10.0000 mg | ORAL_TABLET | Freq: Every day | ORAL | 0 refills | Status: DC
Start: 2023-09-11 — End: 2024-01-10

## 2023-09-11 NOTE — Progress Notes (Signed)
Lucas Sherman [id number] Jun 21, 2001 22 y.o.  Virtual Visit via Video Note  I connected with pt @ on 09/11/23 at 11:40 AM EST by a video enabled telemedicine application and verified that I am speaking with the correct person using two identifiers.   I discussed the limitations of evaluation and management by telemedicine and the availability of in person appointments. The patient expressed understanding and agreed to proceed.  I discussed the assessment and treatment plan with the patient. The patient was provided an opportunity to ask questions and all were answered. The patient agreed with the plan and demonstrated an understanding of the instructions.   The patient was advised to call back or seek an in-person evaluation if the symptoms worsen or if the condition fails to improve as anticipated.  I provided 25 minutes of non-face-to-face time during this encounter.  The patient was located at home.  The provider was located at [facility].   Dorothyann Gibbs, NP   Subjective:   Patient ID:  Lucas Sherman is a 22 y.o. (DOB 09/26/2001) male.  Chief Complaint: No chief complaint on file.   HPI THAILAN LAGRECA presents for follow-up of ADHD, Panic disorder, RLS, Depression, GAD.  Describes mood today as "ok". Pleasant. Denies tearfulness. Mood symptoms - reports decreased anxiety. Reports panic attacks. Denies depression. Reports irritability at times. Reports some worry, rumination, and over thinking at times. Mood is consistent. Stating "I feel like I'm doing better". Feels like medications are helpful. Family supportive. Stable interest and motivation. Taking medications as presribed.  Energy levels stable. Active, has a regular exercise routine.   Enjoys some usual interests and activities. Dating. Has a girlfriend. Spending time with family. Appetite adequate. Weight stable - 180 pounds - 68". Sleeps well most nights. Averages 7 to 9 hours. Reports focus and  concentration difficulties at times. Completing tasks. Managing aspects of household. Taking college classes. Playing baseball - [employer]. Denies SI or HI.  Denies AH or VH. Denies self harm. Denies substance use.   Previous medication trials: Denies     Review of Systems:  Review of Systems  Musculoskeletal:  Negative for gait problem.  Neurological:  Negative for tremors.  Psychiatric/Behavioral:         Please refer to HPI    Medications: I have reviewed the patient's current medications.  Current Outpatient Medications  Medication Sig Dispense Refill   amphetamine-dextroamphetamine (ADDERALL) 10 MG tablet Take 1 tablet (10 mg total) by mouth daily. 30 tablet 0   amphetamine-dextroamphetamine (ADDERALL) 10 MG tablet Take 1 tablet (10 mg total) by mouth daily. 30 tablet 0   amphetamine-dextroamphetamine (ADDERALL) 10 MG tablet Take 1 tablet (10 mg total) by mouth daily. 30 tablet 0   clonazePAM (KLONOPIN) 1 MG tablet TAKE 1 TABLET BY MOUTH TWICE A DAY 60 tablet 2   escitalopram (LEXAPRO) 20 MG tablet Take 1 tablet (20 mg total) by mouth daily. 90 tablet 1   gabapentin (NEURONTIN) 100 MG capsule Take 2 capsules (200 mg total) by mouth 2 (two) times daily. 180 capsule 1   lisdexamfetamine (VYVANSE) 50 MG capsule Take 1 capsule (50 mg total) by mouth daily after breakfast. 30 capsule 0   lisdexamfetamine (VYVANSE) 50 MG capsule Take 1 capsule (50 mg total) by mouth daily after breakfast. 30 capsule 0   lisdexamfetamine (VYVANSE) 50 MG capsule Take 1 capsule (50 mg total) by mouth daily after breakfast. 30 capsule 0   No current facility-administered medications for this visit.  Medication Side Effects: None  Allergies:  Allergies  Allergen Reactions   Reglan [Metoclopramide]     "anxiety"    Past Medical History:  Diagnosis Date   Anxiety    Attention deficit hyperactivity disorder (ADHD), combined type, moderate 04/12/2020   COVID-19    Persistent  depressive disorder with atypical features, currently mild 03/08/2020   Persistent depressive disorder with atypical features, currently mild 03/08/2020    Family History  Problem Relation Age of Onset   Depression Mother    Anxiety disorder Mother    Depression Maternal Grandmother    Anxiety disorder Brother    Panic disorder Brother    Depression Other    Alcohol abuse Other    Arthritis Neg Hx    Asthma Neg Hx    Birth defects Neg Hx    Cancer Neg Hx    COPD Neg Hx    Diabetes Neg Hx    Drug abuse Neg Hx    Early death Neg Hx    Hearing loss Neg Hx    Heart disease Neg Hx    Hyperlipidemia Neg Hx    Learning disabilities Neg Hx    Kidney disease Neg Hx    Hypertension Neg Hx    Mental illness Neg Hx    Miscarriages / Stillbirths Neg Hx    Mental retardation Neg Hx    Stroke Neg Hx    Vision loss Neg Hx    Varicose Veins Neg Hx     Social History   Socioeconomic History   Marital status: Single    Spouse name: Not on file   Number of children: Not on file   Years of education: 14   Highest education level: High school graduate  Occupational History   Occupation: Seeking pro baseball.career  Tobacco Use   Smoking status: Never   Smokeless tobacco: Former  Building services engineer status: Never Used  Substance and Sexual Activity   Alcohol use: Not Currently    Comment: occasional   Drug use: Not Currently    Types: Marijuana   Sexual activity: Not on file  Other Topics Concern   Not on file  Social History Narrative   Scientist, 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  He describes restless legs in his sleep patterns  having to get up for a hot bath or stretches to his legs in order to try to get back to sleep.  Despite longstanding Lexapro and new onset Klonopin, he fears and finds instability still in his illness pattern that undermines his performance in baseball and academics.  He leaves for college 05/18/2019 seeking intervention prior to then after PCP has recently started the Klonopin which helped but affecting tolerance side effects if dosed higher to prevent anxiety or panic.   Social Determinants of Health   Financial Resource Strain: Not on file  Food Insecurity: No Food Insecurity (05/04/2019)   Hunger Vital Sign    Worried About Running Out of Food in the Last Year: Never true    Ran Out of Food in the Last Year: Never true  Transportation Needs: No Transportation Needs (05/04/2019)   PRAPARE - Administrator, Civil Service (Medical):  No    Lack of Transportation (Non-Medical): No  Physical Activity: Not on file  Stress: Stress Concern Present (05/04/2019)   [employer] of Occupational Health - Occupational Stress Questionnaire    Feeling of Stress : Rather much  Social Connections: Unknown (06/18/2023)   [facility] from [facility]   Social Network    Social Network: Not on file  Intimate Partner Violence: Unknown (06/18/2023)   Received from [facility]   HITS    Physically Hurt: Not on file    Insult or Talk Down To: Not on file    Threaten Physical Harm: Not on file    Scream or Curse: Not on file    Past Medical History, Surgical history, Social history, and Family history were reviewed and updated as appropriate.   Please see review of systems for further details on the patient's review from today.   Objective:   Physical Exam:  There were no vitals taken for this visit.  Physical Exam Constitutional:      General: He is not in acute distress. Musculoskeletal:        General: No deformity.  Neurological:     Mental Status: He is alert and oriented to  person, place, and time.     Coordination: Coordination normal.  Psychiatric:        Attention and Perception: Attention and perception normal. He does not perceive auditory or visual hallucinations.        Mood and Affect: Affect is not labile, blunt, angry or inappropriate.        Speech: Speech normal.        Behavior: Behavior normal.        Thought Content: Thought content normal. Thought content is not paranoid or delusional. Thought content does not include homicidal or suicidal ideation. Thought content does not include homicidal or suicidal plan.        Cognition and Memory: Cognition and memory normal.        Judgment: Judgment normal.     Comments: Insight intact     Lab Review:     Component Value Date/Time   NA 138 05/09/2019 1329   K 3.9 05/09/2019 1329   CL 103 05/09/2019 1329   CO2 24 05/09/2019 1329   GLUCOSE 90 05/09/2019 1329   BUN 10 05/09/2019 1329   CREATININE 1.23 05/09/2019 1329   CALCIUM 9.6 05/09/2019 1329   PROT 7.4 04/08/2019 1836   ALBUMIN 4.6 04/08/2019 1836   AST 29 04/08/2019 1836   ALT 57 (H) 04/08/2019 1836   ALKPHOS 51 04/08/2019 1836   BILITOT 0.7 04/08/2019 1836   GFRNONAA >60 05/09/2019 1329   GFRAA >60 05/09/2019 1329       Component Value Date/Time   WBC 5.4 05/09/2019 1329   RBC 5.57 05/09/2019 1329   HGB 16.1 05/09/2019 1329   HCT 49.2 05/09/2019 1329   PLT 297 05/09/2019 1329   MCV 88.3 05/09/2019 1329   MCH 28.9 05/09/2019 1329   MCHC 32.7 05/09/2019 1329   RDW 13.2 05/09/2019 1329   LYMPHSABS 1.1 04/08/2019 1836   MONOABS 0.6 04/08/2019 1836   EOSABS 0.0 04/08/2019 1836   BASOSABS 0.0 04/08/2019 1836    No results found for: "POCLITH", "LITHIUM"   No results found for: "PHENYTOIN", "PHENOBARB", "VALPROATE", "CBMZ"   .res Assessment: Plan:    Plan:  PDMP reviewed  Vyvanse 50mg  daily Gabapentin 200mg  BID Lexapro 20mg  daily Adderall 10mg  daily as needed  Clonazepam - 1mg  BID - brand  name  Monitor BP  between visits while taking stimulant medication.   RTC 4 weeks   Patient advised to contact office with any questions, adverse effects, or acute worsening in signs and symptoms.  Discussed potential benefits, risk, and side effects of benzodiazepines to include potential risk of tolerance and dependence, as well as possible drowsiness.  Advised patient not to drive if experiencing drowsiness and to take lowest possible effective dose to minimize risk of dependence and tolerance.  Discussed potential benefits, risks, and side effects of stimulants with patient to include increased heart rate, palpitations, insomnia, increased anxiety, increased irritability, or decreased appetite.  Instructed patient to contact office if experiencing any significant tolerability issues. There are no diagnoses linked to this encounter.   Please see After Visit Summary for patient specific instructions.  Future Appointments  Date Time Provider Department Center  09/11/2023 11:40 AM Caleel Kiner, Thereasa Solo, NP CP-CP None    No orders of the defined types were placed in this encounter.     -------------------------------

## 2023-10-15 ENCOUNTER — Other Ambulatory Visit (HOSPITAL_BASED_OUTPATIENT_CLINIC_OR_DEPARTMENT_OTHER): Payer: Self-pay

## 2023-10-15 ENCOUNTER — Telehealth: Payer: Self-pay | Admitting: Adult Health

## 2023-10-15 NOTE — Telephone Encounter (Signed)
 Sohail lvm requesting a refill on the generic Vyvanse . Refill at the [facility] [address] [place] [address] Phone: [phone]  Fax: [phone]   Last seen 09/11/23, with no follow up scheduled

## 2023-10-15 NOTE — Telephone Encounter (Signed)
 Pt now has appt on 3/11

## 2023-10-15 NOTE — Telephone Encounter (Signed)
 Patient has RF available, he was notified.

## 2023-10-22 ENCOUNTER — Encounter: Payer: Self-pay | Admitting: Adult Health

## 2023-10-22 ENCOUNTER — Telehealth: Payer: Commercial Managed Care - PPO | Admitting: Adult Health

## 2023-10-22 DIAGNOSIS — F411 Generalized anxiety disorder: Secondary | ICD-10-CM | POA: Diagnosis not present

## 2023-10-22 DIAGNOSIS — F909 Attention-deficit hyperactivity disorder, unspecified type: Secondary | ICD-10-CM | POA: Diagnosis not present

## 2023-10-22 DIAGNOSIS — F410 Panic disorder [episodic paroxysmal anxiety] without agoraphobia: Secondary | ICD-10-CM | POA: Diagnosis not present

## 2023-10-22 DIAGNOSIS — F902 Attention-deficit hyperactivity disorder, combined type: Secondary | ICD-10-CM

## 2023-10-22 DIAGNOSIS — F341 Dysthymic disorder: Secondary | ICD-10-CM

## 2023-10-22 DIAGNOSIS — G2581 Restless legs syndrome: Secondary | ICD-10-CM | POA: Diagnosis not present

## 2023-10-22 DIAGNOSIS — F32A Depression, unspecified: Secondary | ICD-10-CM

## 2023-10-22 MED ORDER — PROPRANOLOL HCL 10 MG PO TABS: 10.0000 mg | ORAL_TABLET | Freq: Two times a day (BID) | ORAL | 2 refills | Status: DC

## 2023-10-22 MED ORDER — PROPRANOLOL HCL 10 MG PO TABS: 10.0000 mg | ORAL_TABLET | Freq: Three times a day (TID) | ORAL | 2 refills | Status: DC

## 2023-10-22 NOTE — Progress Notes (Signed)
Lucas Sherman [id number] [birth year] 23 y.o.  Virtual Visit via Video Note  I connected with pt @ on 10/22/23 at  4:30 PM EST by a video enabled telemedicine application and verified that I am speaking with the correct person using two identifiers.   I discussed the limitations of evaluation and management by telemedicine and the availability of in person appointments. The patient expressed understanding and agreed to proceed.  I discussed the assessment and treatment plan with the patient. The patient was provided an opportunity to ask questions and all were answered. The patient agreed with the plan and demonstrated an understanding of the instructions.   The patient was advised to call back or seek an in-person evaluation if the symptoms worsen or if the condition fails to improve as anticipated.  I provided 25 minutes of non-face-to-face time during this encounter.  The patient was located at home.  The provider was located at [facility].   Dorothyann Gibbs, NP   Subjective:   Patient ID:  Lucas Sherman is a 23 y.o. (DOB [birth year]) male.  Chief Complaint: No chief complaint on file.   HPI DAREL VANVALKENBURG presents for follow-up of ADHD, Panic disorder, RLS, Depression, GAD.  Describes mood today as "ok". Pleasant. Denies tearfulness. Mood symptoms - reports increased anxiety - "crippling some days". Reports panic attacks. Denies depression. Reports irritability at times. Reports some worry, rumination, and over thinking at times. Mood is consistent. Stating "I feel like I'm struggling, but I'm going to make it". Feels like medications are helpful, but is willing to consider other options to help manage increased anxiety. Stable interest and motivation. Taking medications as presribed.  Energy levels stable. Active, has a regular exercise routine.   Enjoys some usual interests and activities. Single - not dating. Spending time with family. Appetite adequate. Weight  loss - 175 from 180 pounds - 68". Sleeps well most nights. Averages 7 to 9 hours. Reports focus and concentration difficulties at times. Completing tasks. Managing aspects of household. Taking college classes. Playing baseball - [employer]. Denies SI or HI.  Denies AH or VH. Denies self harm. Denies substance use.   Previous medication trials: Denies  Review of Systems:  Review of Systems  Musculoskeletal:  Negative for gait problem.  Neurological:  Negative for tremors.  Psychiatric/Behavioral:         Please refer to HPI    Medications: I have reviewed the patient's current medications.  Current Outpatient Medications  Medication Sig Dispense Refill   amphetamine-dextroamphetamine (ADDERALL) 10 MG tablet Take 1 tablet (10 mg total) by mouth daily. 30 tablet 0   amphetamine-dextroamphetamine (ADDERALL) 10 MG tablet Take 1 tablet (10 mg total) by mouth daily. 30 tablet 0   [START ON 11/06/2023] amphetamine-dextroamphetamine (ADDERALL) 10 MG tablet Take 1 tablet (10 mg total) by mouth daily. 30 tablet 0   clonazePAM (KLONOPIN) 1 MG tablet Take 1 tablet (1 mg total) by mouth 2 (two) times daily. 60 tablet 2   escitalopram (LEXAPRO) 20 MG tablet Take 1 tablet (20 mg total) by mouth daily. 90 tablet 1   gabapentin (NEURONTIN) 100 MG capsule Take 2 capsules (200 mg total) by mouth 2 (two) times daily. 180 capsule 1   lisdexamfetamine (VYVANSE) 50 MG capsule Take 1 capsule (50 mg total) by mouth daily after breakfast. 30 capsule 0   lisdexamfetamine (VYVANSE) 50 MG capsule Take 1 capsule (50 mg total) by mouth daily after breakfast. 30 capsule 0   [START ON  11/06/2023] lisdexamfetamine (VYVANSE) 50 MG capsule Take 1 capsule (50 mg total) by mouth daily after breakfast. 30 capsule 0   No current facility-administered medications for this visit.    Medication Side Effects: None  Allergies:  Allergies  Allergen Reactions   Reglan [Metoclopramide]     "anxiety"    Past Medical  History:  Diagnosis Date   Anxiety    Attention deficit hyperactivity disorder (ADHD), combined type, moderate 04/12/2020   COVID-19    Persistent depressive disorder with atypical features, currently mild 03/08/2020   Persistent depressive disorder with atypical features, currently mild 03/08/2020    Family History  Problem Relation Age of Onset   Depression Mother    Anxiety disorder Mother    Depression Maternal Grandmother    Anxiety disorder Brother    Panic disorder Brother    Depression Other    Alcohol abuse Other    Arthritis Neg Hx    Asthma Neg Hx    Birth defects Neg Hx    Cancer Neg Hx    COPD Neg Hx    Diabetes Neg Hx    Drug abuse Neg Hx    Early death Neg Hx    Hearing loss Neg Hx    Heart disease Neg Hx    Hyperlipidemia Neg Hx    Learning disabilities Neg Hx    Kidney disease Neg Hx    Hypertension Neg Hx    Mental illness Neg Hx    Miscarriages / Stillbirths Neg Hx    Mental retardation Neg Hx    Stroke Neg Hx    Vision loss Neg Hx    Varicose Veins Neg Hx     Social History   Socioeconomic History   Marital status: Single    Spouse name: Not on file   Number of children: Not on file   Years of education: 14   Highest education level: High school graduate  Occupational History   Occupation: Seeking pro baseball.[email or web address]  Tobacco Use   Smoking status: Never   Smokeless tobacco: Former  Building services engineer status: Never Used  Substance and Sexual Activity   Alcohol use: Not Currently    Comment: occasional   Drug use: Not Currently    Types: Marijuana   Sexual activity: Not on file  Other Topics Concern   Not on file  Social History Narrative   Scientist, 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  He describes restless legs in his sleep patterns having to get up for a hot bath or stretches to his legs in order to try to get back to sleep.  Despite longstanding Lexapro and new onset Klonopin, he fears and finds instability still in his illness pattern that undermines his performance in baseball and academics.  He leaves for college 05/18/2019 seeking intervention prior to then after PCP has recently started the Klonopin which helped but affecting tolerance side effects if dosed higher to prevent anxiety or panic.   Social Drivers of Corporate investment banker Strain: Not on file  Food Insecurity: No Food Insecurity (05/04/2019)   Hunger Vital Sign    Worried About Running Out of Food in the Last Year: Never true  Ran Out of Food in the Last Year: Never true  Transportation Needs: No Transportation Needs (05/04/2019)   PRAPARE - Administrator, Civil Service (Medical): No    Lack of Transportation (Non-Medical): No  Physical Activity: Not on file  Stress: Stress Concern Present (05/04/2019)   [employer] of Occupational Health - Occupational Stress Questionnaire    Feeling of Stress : Rather much  Social Connections: Unknown (06/18/2023)   Received [facility]   Social Network    Social Network: Not on file  Intimate Partner Violence: Unknown (06/18/2023)   Received from [facility]   HITS    Physically Hurt: Not on file    Insult or Talk Down To: Not on file    Threaten Physical Harm: Not on file    Scream or Curse: Not on file    Past Medical History, Surgical history, Social history, and Family history were reviewed and updated as appropriate.   Please see review of systems for further details on the patient's review from today.   Objective:   Physical Exam:  There were no vitals taken for this visit.  Physical Exam Constitutional:      General: He  is not in acute distress. Musculoskeletal:        General: No deformity.  Neurological:     General: No focal deficit present.     Mental Status: He is alert and oriented to person, place, and time.     Coordination: Coordination normal.  Psychiatric:        Attention and Perception: Attention and perception normal. He does not perceive auditory or visual hallucinations.        Mood and Affect: Affect is not labile, blunt, angry or inappropriate.        Speech: Speech normal.        Behavior: Behavior normal.        Thought Content: Thought content normal. Thought content is not paranoid or delusional. Thought content does not include homicidal or suicidal ideation. Thought content does not include homicidal or suicidal plan.        Cognition and Memory: Cognition and memory normal.        Judgment: Judgment normal.     Comments: Insight intact     Lab Review:     Component Value Date/Time   NA 138 05/09/2019 1329   K 3.9 05/09/2019 1329   CL 103 05/09/2019 1329   CO2 24 05/09/2019 1329   GLUCOSE 90 05/09/2019 1329   BUN 10 05/09/2019 1329   CREATININE 1.23 05/09/2019 1329   CALCIUM 9.6 05/09/2019 1329   PROT 7.4 04/08/2019 1836   ALBUMIN 4.6 04/08/2019 1836   AST 29 04/08/2019 1836   ALT 57 (H) 04/08/2019 1836   ALKPHOS 51 04/08/2019 1836   BILITOT 0.7 04/08/2019 1836   GFRNONAA >60 05/09/2019 1329   GFRAA >60 05/09/2019 1329       Component Value Date/Time   WBC 5.4 05/09/2019 1329   RBC 5.57 05/09/2019 1329   HGB 16.1 05/09/2019 1329   HCT 49.2 05/09/2019 1329   PLT 297 05/09/2019 1329   MCV 88.3 05/09/2019 1329   MCH 28.9 05/09/2019 1329   MCHC 32.7 05/09/2019 1329   RDW 13.2 05/09/2019 1329   LYMPHSABS 1.1 04/08/2019 1836   MONOABS 0.6 04/08/2019 1836   EOSABS 0.0 04/08/2019 1836   BASOSABS 0.0 04/08/2019 1836    No results found for: "POCLITH", "LITHIUM"   No results found for: "  PHENYTOIN", "PHENOBARB", "VALPROATE", "CBMZ"   .res Assessment: Plan:      Plan:  PDMP reviewed  Vyvanse 50mg  daily Gabapentin 200mg  BID Lexapro 20mg  daily Adderall 10mg  daily as needed  Clonazepam - 1mg  BID - brand name  Add Propranolol 10mg  twice daily as needed  25 minutes spent dedicated to the care of this patient on the date of this encounter to include pre-visit review of records, ordering of medication, post visit documentation, and face-to-face time with the patient discussing depression, anxiety, panic disorder and ADD. Discussed adding  Propranolol to help manage anxiety.  Monitor BP between visits while taking stimulant medication.   RTC 4 weeks   Patient advised to contact office with any questions, adverse effects, or acute worsening in signs and symptoms.  Discussed potential benefits, risk, and side effects of benzodiazepines to include potential risk of tolerance and dependence, as well as possible drowsiness.  Advised patient not to drive if experiencing drowsiness and to take lowest possible effective dose to minimize risk of dependence and tolerance.  Discussed potential benefits, risks, and side effects of stimulants with patient to include increased heart rate, palpitations, insomnia, increased anxiety, increased irritability, or decreased appetite.  Instructed patient to contact office if experiencing any significant tolerability issues. There are no diagnoses linked to this encounter.   Please see After Visit Summary for patient specific instructions.  Future Appointments  Date Time Provider Department Center  10/22/2023  4:30 PM Chantavia Bazzle, Thereasa Solo, NP [facility] None  12/10/2023 11:30 AM Demari Kropp, Thereasa Solo, NP [facility] None    No orders of the defined types were placed in this encounter.     -------------------------------

## 2023-11-13 ENCOUNTER — Other Ambulatory Visit (HOSPITAL_BASED_OUTPATIENT_CLINIC_OR_DEPARTMENT_OTHER): Payer: Self-pay

## 2023-12-10 ENCOUNTER — Encounter: Payer: Self-pay | Admitting: Adult Health

## 2023-12-10 ENCOUNTER — Telehealth (INDEPENDENT_AMBULATORY_CARE_PROVIDER_SITE_OTHER): Payer: Self-pay | Admitting: Adult Health

## 2023-12-10 DIAGNOSIS — Z0389 Encounter for observation for other suspected diseases and conditions ruled out: Secondary | ICD-10-CM

## 2023-12-10 NOTE — Progress Notes (Signed)
 Patient no show appointment. Called and LVM to R/S appt.

## 2023-12-10 NOTE — Progress Notes (Deleted)
 Lucas Sherman [id number] [birth year] 23 y.o.  Virtual Visit via Video Note  I connected with pt @ on 12/10/23 at 11:30 AM EDT by a video enabled telemedicine application and verified that I am speaking with the correct person using two identifiers.   I discussed the limitations of evaluation and management by telemedicine and the availability of in person appointments. The patient expressed understanding and agreed to proceed.  I discussed the assessment and treatment plan with the patient. The patient was provided an opportunity to ask questions and all were answered. The patient agreed with the plan and demonstrated an understanding of the instructions.   The patient was advised to call back or seek an in-person evaluation if the symptoms worsen or if the condition fails to improve as anticipated.  I provided *** minutes of non-face-to-face time during this encounter.  The patient was located at home.  The provider was located at [facility].   Dorothyann Gibbs, NP   Subjective:   Patient ID:  Lucas Sherman is a 23 y.o. (DOB [birth year]) male.  Chief Complaint: No chief complaint on file.   HPI Lucas Sherman presents for follow-up of ***   Review of Systems:  Review of Systems  Medications: {medication reviewed/display:3041432}  Current Outpatient Medications  Medication Sig Dispense Refill   amphetamine-dextroamphetamine (ADDERALL) 10 MG tablet Take 1 tablet (10 mg total) by mouth daily. 30 tablet 0   amphetamine-dextroamphetamine (ADDERALL) 10 MG tablet Take 1 tablet (10 mg total) by mouth daily. 30 tablet 0   amphetamine-dextroamphetamine (ADDERALL) 10 MG tablet Take 1 tablet (10 mg total) by mouth daily. 30 tablet 0   clonazePAM (KLONOPIN) 1 MG tablet Take 1 tablet (1 mg total) by mouth 2 (two) times daily. 60 tablet 2   escitalopram (LEXAPRO) 20 MG tablet Take 1 tablet (20 mg total) by mouth daily. 90 tablet 1   gabapentin (NEURONTIN) 100 MG capsule Take  2 capsules (200 mg total) by mouth 2 (two) times daily. 180 capsule 1   lisdexamfetamine (VYVANSE) 50 MG capsule Take 1 capsule (50 mg total) by mouth daily after breakfast. 30 capsule 0   lisdexamfetamine (VYVANSE) 50 MG capsule Take 1 capsule (50 mg total) by mouth daily after breakfast. 30 capsule 0   lisdexamfetamine (VYVANSE) 50 MG capsule Take 1 capsule (50 mg total) by mouth daily after breakfast. 30 capsule 0   propranolol (INDERAL) 10 MG tablet Take 1 tablet (10 mg total) by mouth 2 (two) times daily. 60 tablet 2   No current facility-administered medications for this visit.    Medication Side Effects: {Medication Side Effects (Optional):21014029}  Allergies:  Allergies  Allergen Reactions   Metoclopramide Anxiety    "anxiety"    Past Medical History:  Diagnosis Date   Anxiety    Attention deficit hyperactivity disorder (ADHD), combined type, moderate 04/12/2020   COVID-19    Persistent depressive disorder with atypical features, currently mild 03/08/2020   Persistent depressive disorder with atypical features, currently mild 03/08/2020    Family History  Problem Relation Age of Onset   Depression Mother    Anxiety disorder Mother    Depression Maternal Grandmother    Anxiety disorder Brother    Panic disorder Brother    Depression Other    Alcohol abuse Other    Arthritis Neg Hx    Asthma Neg Hx    Birth defects Neg Hx    Cancer Neg Hx    COPD Neg Hx  Diabetes Neg Hx    Drug abuse Neg Hx    Early death Neg Hx    Hearing loss Neg Hx    Heart disease Neg Hx    Hyperlipidemia Neg Hx    Learning disabilities Neg Hx    Kidney disease Neg Hx    Hypertension Neg Hx    Mental illness Neg Hx    Miscarriages / Stillbirths Neg Hx    Mental retardation Neg Hx    Stroke Neg Hx    Vision loss Neg Hx    Varicose Veins Neg Hx     Social History   Socioeconomic History   Marital status: Single    Spouse name: Not on file   Number of children: Not on file   Years  of education: 14   Highest education level: High school graduate  Occupational History   Occupation: Seeking pro baseball.career  Tobacco Use   Smoking status: Never   Smokeless tobacco: Former  Building services engineer status: Never Used  Substance and Sexual Activity   Alcohol use: Not Currently    Comment: occasional   Drug use: Not Currently    Types: Marijuana   Sexual activity: Not on file  Other Topics Concern   Not on file  Social History Narrative   Scientist, 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  He describes restless legs in his sleep patterns having to get up for a hot bath or stretches to his legs in order to try to get back to sleep.  Despite longstanding Lexapro and new onset Klonopin, he fears and finds instability still in his illness pattern that undermines his performance in baseball and academics.  He leaves for college 05/18/2019 seeking intervention prior to then after PCP has recently started the Klonopin which helped but affecting tolerance side effects if dosed higher to prevent anxiety or panic.   Social Drivers of Corporate investment banker Strain: Not on file  Food Insecurity: No Food Insecurity (05/04/2019)   Hunger Vital Sign    Worried About Running Out of Food in the Last Year: Never true    Ran Out of Food in the Last Year: Never true  Transportation Needs: No Transportation Needs (05/04/2019)   PRAPARE - Administrator, Civil Service (Medical): No    Lack of Transportation (Non-Medical): No  Physical Activity: Not on file  Stress: Stress Concern Present (05/04/2019)   [employer] of  Occupational Health - Occupational Stress Questionnaire    Feeling of Stress : Rather much  Social Connections: Unknown (06/18/2023)   Received from [facility]   Social Network    Social Network: Not on file  Intimate Partner Violence: Unknown (06/18/2023)   Received from [facility]   HITS    Physically Hurt: Not on file    Insult or Talk Down To: Not on file    Threaten Physical Harm: Not on file    Scream or Curse: Not on file    Past Medical History, Surgical history, Social history, and Family history were reviewed and updated as appropriate.   Please see review of systems for further details on the patient's review from today.   Objective:  Physical Exam:  There were no vitals taken for this visit.  Physical Exam  Lab Review:     Component Value Date/Time   NA 138 05/09/2019 1329   K 3.9 05/09/2019 1329   CL 103 05/09/2019 1329   CO2 24 05/09/2019 1329   GLUCOSE 90 05/09/2019 1329   BUN 10 05/09/2019 1329   CREATININE 1.23 05/09/2019 1329   CALCIUM 9.6 05/09/2019 1329   PROT 7.4 04/08/2019 1836   ALBUMIN 4.6 04/08/2019 1836   AST 29 04/08/2019 1836   ALT 57 (H) 04/08/2019 1836   ALKPHOS 51 04/08/2019 1836   BILITOT 0.7 04/08/2019 1836   GFRNONAA >60 05/09/2019 1329   GFRAA >60 05/09/2019 1329       Component Value Date/Time   WBC 5.4 05/09/2019 1329   RBC 5.57 05/09/2019 1329   HGB 16.1 05/09/2019 1329   HCT 49.2 05/09/2019 1329   PLT 297 05/09/2019 1329   MCV 88.3 05/09/2019 1329   MCH 28.9 05/09/2019 1329   MCHC 32.7 05/09/2019 1329   RDW 13.2 05/09/2019 1329   LYMPHSABS 1.1 04/08/2019 1836   MONOABS 0.6 04/08/2019 1836   EOSABS 0.0 04/08/2019 1836   BASOSABS 0.0 04/08/2019 1836    No results found for: "POCLITH", "LITHIUM"   No results found for: "PHENYTOIN", "PHENOBARB", "VALPROATE", "CBMZ"   .res Assessment: Plan:    There are no diagnoses linked to this encounter.   Please see After Visit Summary for patient specific  instructions.  Future Appointments  Date Time Provider Department Center  12/10/2023 11:30 AM Jorita Bohanon, Thereasa Solo, NP [facility] None    No orders of the defined types were placed in this encounter.     -------------------------------

## 2023-12-13 ENCOUNTER — Other Ambulatory Visit: Payer: Self-pay

## 2023-12-13 ENCOUNTER — Other Ambulatory Visit (HOSPITAL_BASED_OUTPATIENT_CLINIC_OR_DEPARTMENT_OTHER): Payer: Self-pay

## 2023-12-13 ENCOUNTER — Other Ambulatory Visit (HOSPITAL_COMMUNITY): Payer: Self-pay

## 2023-12-16 ENCOUNTER — Other Ambulatory Visit: Payer: Self-pay

## 2023-12-30 ENCOUNTER — Telehealth (INDEPENDENT_AMBULATORY_CARE_PROVIDER_SITE_OTHER): Payer: Self-pay | Admitting: Adult Health

## 2023-12-30 DIAGNOSIS — Z0389 Encounter for observation for other suspected diseases and conditions ruled out: Secondary | ICD-10-CM

## 2023-12-30 NOTE — Progress Notes (Deleted)
 Lucas Sherman [id number] 2001-07-11 22 y.o.  Virtual Visit via Video Note  I connected with pt @ on 12/30/23 at 10:30 AM EDT by a video enabled telemedicine application and verified that I am speaking with the correct person using two identifiers.   I discussed the limitations of evaluation and management by telemedicine and the availability of in person appointments. The patient expressed understanding and agreed to proceed.  I discussed the assessment and treatment plan with the patient. The patient was provided an opportunity to ask questions and all were answered. The patient agreed with the plan and demonstrated an understanding of the instructions.   The patient was advised to call back or seek an in-person evaluation if the symptoms worsen or if the condition fails to improve as anticipated.  I provided *** minutes of non-face-to-face time during this encounter.  The patient was located at home.  The provider was located at [facility].   Lucas Gibbs, NP   Subjective:   Patient ID:  Lucas Sherman is a 23 y.o. (DOB Jan 11, 2001) male.  Chief Complaint: No chief complaint on file.   HPI Lucas Sherman presents for follow-up of ***   Review of Systems:  Review of Systems  Medications: {medication reviewed/display:3041432}  Current Outpatient Medications  Medication Sig Dispense Refill   amphetamine-dextroamphetamine (ADDERALL) 10 MG tablet Take 1 tablet (10 mg total) by mouth daily. 30 tablet 0   amphetamine-dextroamphetamine (ADDERALL) 10 MG tablet Take 1 tablet (10 mg total) by mouth daily. 30 tablet 0   amphetamine-dextroamphetamine (ADDERALL) 10 MG tablet Take 1 tablet (10 mg total) by mouth daily. 30 tablet 0   clonazePAM (KLONOPIN) 1 MG tablet Take 1 tablet (1 mg total) by mouth 2 (two) times daily. 60 tablet 2   escitalopram (LEXAPRO) 20 MG tablet Take 1 tablet (20 mg total) by mouth daily. 90 tablet 1   gabapentin (NEURONTIN) 100 MG capsule Take  2 capsules (200 mg total) by mouth 2 (two) times daily. 180 capsule 1   lisdexamfetamine (VYVANSE) 50 MG capsule Take 1 capsule (50 mg total) by mouth daily after breakfast. 30 capsule 0   lisdexamfetamine (VYVANSE) 50 MG capsule Take 1 capsule (50 mg total) by mouth daily after breakfast. 30 capsule 0   lisdexamfetamine (VYVANSE) 50 MG capsule Take 1 capsule (50 mg total) by mouth daily after breakfast. 30 capsule 0   propranolol (INDERAL) 10 MG tablet Take 1 tablet (10 mg total) by mouth 2 (two) times daily. 60 tablet 2   No current facility-administered medications for this visit.    Medication Side Effects: {Medication Side Effects (Optional):21014029}  Allergies:  Allergies  Allergen Reactions   Metoclopramide Anxiety    "anxiety"    Past Medical History:  Diagnosis Date   Anxiety    Attention deficit hyperactivity disorder (ADHD), combined type, moderate 04/12/2020   COVID-19    Persistent depressive disorder with atypical features, currently mild 03/08/2020   Persistent depressive disorder with atypical features, currently mild 03/08/2020    Family History  Problem Relation Age of Onset   Depression Mother    Anxiety disorder Mother    Depression Maternal Grandmother    Anxiety disorder Brother    Panic disorder Brother    Depression Other    Alcohol abuse Other    Arthritis Neg Hx    Asthma Neg Hx    Birth defects Neg Hx    Cancer Neg Hx    COPD Neg Hx  Diabetes Neg Hx    Drug abuse Neg Hx    Early death Neg Hx    Hearing loss Neg Hx    Heart disease Neg Hx    Hyperlipidemia Neg Hx    Learning disabilities Neg Hx    Kidney disease Neg Hx    Hypertension Neg Hx    Mental illness Neg Hx    Miscarriages / Stillbirths Neg Hx    Mental retardation Neg Hx    Stroke Neg Hx    Vision loss Neg Hx    Varicose Veins Neg Hx     Social History   Socioeconomic History   Marital status: Single    Spouse name: Not on file   Number of children: Not on file   Years  of education: 14   Highest education level: High school graduate  Occupational History   Occupation: Seeking pro baseball.career  Tobacco Use   Smoking status: Never   Smokeless tobacco: Former  Building services engineer status: Never Used  Substance and Sexual Activity   Alcohol use: Not Currently    Comment: occasional   Drug use: Not Currently    Types: Marijuana   Sexual activity: Not on file  Other Topics Concern   Not on file  Social History Narrative   Scientist, 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  He describes restless legs in his sleep patterns having to get up for a hot bath or stretches to his legs in order to try to get back to sleep.  Despite longstanding Lexapro and new onset Klonopin, he fears and finds instability still in his illness pattern that undermines his performance in baseball and academics.  He leaves for college 05/18/2019 seeking intervention prior to then after PCP has recently started the Klonopin which helped but affecting tolerance side effects if dosed higher to prevent anxiety or panic.   Social Drivers of Corporate investment banker Strain: Not on file  Food Insecurity: No Food Insecurity (05/04/2019)   Hunger Vital Sign    Worried About Running Out of Food in the Last Year: Never true    Ran Out of Food in the Last Year: Never true  Transportation Needs: No Transportation Needs (05/04/2019)   PRAPARE - Administrator, Civil Service (Medical): No    Lack of Transportation (Non-Medical): No  Physical Activity: Not on file  Stress: Stress Concern Present (05/04/2019)   [employer] of  Occupational Health - Occupational Stress Questionnaire    Feeling of Stress : Rather much  Social Connections: Unknown (06/18/2023)   Received from [facility]   Social Network    Social Network: Not on file  Intimate Partner Violence: Unknown (06/18/2023)   Received from [facility]   HITS    Physically Hurt: Not on file    Insult or Talk Down To: Not on file    Threaten Physical Harm: Not on file    Scream or Curse: Not on file    Past Medical History, Surgical history, Social history, and Family history were reviewed and updated as appropriate.   Please see review of systems for further details on the patient's review from today.   Objective:  Physical Exam:  There were no vitals taken for this visit.  Physical Exam  Lab Review:     Component Value Date/Time   NA 138 05/09/2019 1329   K 3.9 05/09/2019 1329   CL 103 05/09/2019 1329   CO2 24 05/09/2019 1329   GLUCOSE 90 05/09/2019 1329   BUN 10 05/09/2019 1329   CREATININE 1.23 05/09/2019 1329   CALCIUM 9.6 05/09/2019 1329   PROT 7.4 04/08/2019 1836   ALBUMIN 4.6 04/08/2019 1836   AST 29 04/08/2019 1836   ALT 57 (H) 04/08/2019 1836   ALKPHOS 51 04/08/2019 1836   BILITOT 0.7 04/08/2019 1836   GFRNONAA >60 05/09/2019 1329   GFRAA >60 05/09/2019 1329       Component Value Date/Time   WBC 5.4 05/09/2019 1329   RBC 5.57 05/09/2019 1329   HGB 16.1 05/09/2019 1329   HCT 49.2 05/09/2019 1329   PLT 297 05/09/2019 1329   MCV 88.3 05/09/2019 1329   MCH 28.9 05/09/2019 1329   MCHC 32.7 05/09/2019 1329   RDW 13.2 05/09/2019 1329   LYMPHSABS 1.1 04/08/2019 1836   MONOABS 0.6 04/08/2019 1836   EOSABS 0.0 04/08/2019 1836   BASOSABS 0.0 04/08/2019 1836    No results found for: "POCLITH", "LITHIUM"   No results found for: "PHENYTOIN", "PHENOBARB", "VALPROATE", "CBMZ"   .res Assessment: Plan:    There are no diagnoses linked to this encounter.   Please see After Visit Summary for patient specific  instructions.  No future appointments.  No orders of the defined types were placed in this encounter.     -------------------------------

## 2023-12-30 NOTE — Progress Notes (Signed)
 Patient no show appointment. ? ?

## 2024-01-09 ENCOUNTER — Telehealth: Payer: Self-pay | Admitting: Adult Health

## 2024-01-09 ENCOUNTER — Other Ambulatory Visit: Payer: Self-pay

## 2024-01-09 DIAGNOSIS — F902 Attention-deficit hyperactivity disorder, combined type: Secondary | ICD-10-CM

## 2024-01-09 NOTE — Telephone Encounter (Signed)
 Pended  Adderall 10mg  to    [id number] [place], [place] -  Is scheduled for FU 4/11, was a no show 2 previous appt

## 2024-01-09 NOTE — Telephone Encounter (Signed)
 Pt called at 1:10p requesting of Adderall 10mg  to   [id number] [place], [place] - [address], [place] [address] Phone: [phone]  Fax: [phone]   Next appt 4/11

## 2024-01-10 ENCOUNTER — Telehealth (INDEPENDENT_AMBULATORY_CARE_PROVIDER_SITE_OTHER): Admitting: Adult Health

## 2024-01-10 ENCOUNTER — Other Ambulatory Visit (HOSPITAL_BASED_OUTPATIENT_CLINIC_OR_DEPARTMENT_OTHER): Payer: Self-pay

## 2024-01-10 ENCOUNTER — Encounter: Payer: Self-pay | Admitting: Adult Health

## 2024-01-10 DIAGNOSIS — F988 Other specified behavioral and emotional disorders with onset usually occurring in childhood and adolescence: Secondary | ICD-10-CM

## 2024-01-10 DIAGNOSIS — F419 Anxiety disorder, unspecified: Secondary | ICD-10-CM | POA: Diagnosis not present

## 2024-01-10 DIAGNOSIS — F32A Depression, unspecified: Secondary | ICD-10-CM

## 2024-01-10 DIAGNOSIS — F902 Attention-deficit hyperactivity disorder, combined type: Secondary | ICD-10-CM

## 2024-01-10 DIAGNOSIS — F341 Dysthymic disorder: Secondary | ICD-10-CM

## 2024-01-10 DIAGNOSIS — G2581 Restless legs syndrome: Secondary | ICD-10-CM

## 2024-01-10 DIAGNOSIS — F410 Panic disorder [episodic paroxysmal anxiety] without agoraphobia: Secondary | ICD-10-CM | POA: Diagnosis not present

## 2024-01-10 DIAGNOSIS — F411 Generalized anxiety disorder: Secondary | ICD-10-CM

## 2024-01-10 MED ORDER — AMPHETAMINE-DEXTROAMPHETAMINE 10 MG PO TABS: 10.0000 mg | ORAL_TABLET | Freq: Every day | ORAL | 0 refills | Status: DC

## 2024-01-10 MED ORDER — CLONAZEPAM 1 MG PO TABS: 1.0000 mg | ORAL_TABLET | Freq: Two times a day (BID) | ORAL | 2 refills | Status: DC

## 2024-01-10 MED ORDER — ESCITALOPRAM OXALATE 20 MG PO TABS: 20.0000 mg | ORAL_TABLET | Freq: Every day | ORAL | 1 refills | Status: DC

## 2024-01-10 MED ORDER — LISDEXAMFETAMINE DIMESYLATE 50 MG PO CAPS
50.0000 mg | ORAL_CAPSULE | Freq: Every day | ORAL | 0 refills | Status: DC
  Filled 2024-03-17: qty 30, 30d supply, fill #0

## 2024-01-10 MED ORDER — LISDEXAMFETAMINE DIMESYLATE 50 MG PO CAPS
50.0000 mg | ORAL_CAPSULE | Freq: Every day | ORAL | 0 refills | Status: DC
  Filled 2024-01-10: qty 30, 30d supply, fill #0

## 2024-01-10 MED ORDER — LISDEXAMFETAMINE DIMESYLATE 50 MG PO CAPS
50.0000 mg | ORAL_CAPSULE | Freq: Every day | ORAL | 0 refills | Status: DC
  Filled 2024-02-18: qty 30, 30d supply, fill #0

## 2024-01-10 NOTE — Progress Notes (Signed)
 Lucas Sherman [id number] Nov 14, 2000 22 y.o.  Virtual Visit via Video Note  I connected with pt @ on 01/10/24 at 11:30 AM EDT by a video enabled telemedicine application and verified that I am speaking with the correct person using two identifiers.   I discussed the limitations of evaluation and management by telemedicine and the availability of in person appointments. The patient expressed understanding and agreed to proceed.  I discussed the assessment and treatment plan with the patient. The patient was provided an opportunity to ask questions and all were answered. The patient agreed with the plan and demonstrated an understanding of the instructions.   The patient was advised to call back or seek an in-person evaluation if the symptoms worsen or if the condition fails to improve as anticipated.  I provided 25 minutes of non-face-to-face time during this encounter.  The patient was located at home.  The provider was located at [facility].   Dorothyann Gibbs, NP   Subjective:   Patient ID:  Lucas Sherman is a 23 y.o. (DOB 2001-03-08) male.  Chief Complaint: No chief complaint on file.   HPI Lucas Sherman presents for follow-up of ADHD, Panic disorder, RLS, Depression, GAD.  Describes mood today as "ok". Pleasant. Denies tearfulness. Mood symptoms - reports decreased anxiety. Reports decreased panic attacks. Denies depression and irritability. Reports stable interest and motivation. Denies worry, rumination and over thinking at times. Mood is consistent. Stating "I feel like everything is going well". Feels like current medications work well. Taking medications as presribed.  Energy levels stable. Active, has a regular exercise routine.   Enjoys some usual interests and activities. Single - dating. Spending time with family. Appetite adequate. Weight stable - 175 to 180 pounds - 68". Sleeps well most nights. Averages 7 to 9 hours. Reports focus and concentration  stable. Completing tasks. Managing aspects of household. Working for Museum/gallery curator.   Denies SI or HI.  Denies AH or VH. Denies self harm. Denies substance use.   Previous medication trials: Denies  Review of Systems:  Review of Systems  Musculoskeletal:  Negative for gait problem.  Neurological:  Negative for tremors.  Psychiatric/Behavioral:         Please refer to HPI    Medications: I have reviewed the patient's current medications.  Current Outpatient Medications  Medication Sig Dispense Refill   amphetamine-dextroamphetamine (ADDERALL) 10 MG tablet Take 1 tablet (10 mg total) by mouth daily. 30 tablet 0   amphetamine-dextroamphetamine (ADDERALL) 10 MG tablet Take 1 tablet (10 mg total) by mouth daily. 30 tablet 0   amphetamine-dextroamphetamine (ADDERALL) 10 MG tablet Take 1 tablet (10 mg total) by mouth daily. 30 tablet 0   clonazePAM (KLONOPIN) 1 MG tablet Take 1 tablet (1 mg total) by mouth 2 (two) times daily. 60 tablet 2   escitalopram (LEXAPRO) 20 MG tablet Take 1 tablet (20 mg total) by mouth daily. 90 tablet 1   gabapentin (NEURONTIN) 100 MG capsule Take 2 capsules (200 mg total) by mouth 2 (two) times daily. 180 capsule 1   lisdexamfetamine (VYVANSE) 50 MG capsule Take 1 capsule (50 mg total) by mouth daily after breakfast. 30 capsule 0   lisdexamfetamine (VYVANSE) 50 MG capsule Take 1 capsule (50 mg total) by mouth daily after breakfast. 30 capsule 0   lisdexamfetamine (VYVANSE) 50 MG capsule Take 1 capsule (50 mg total) by mouth daily after breakfast. 30 capsule 0   propranolol (INDERAL) 10 MG tablet Take 1 tablet (10 mg  total) by mouth 2 (two) times daily. 60 tablet 2   No current facility-administered medications for this visit.    Medication Side Effects: None  Allergies:  Allergies  Allergen Reactions   Metoclopramide Anxiety    "anxiety"    Past Medical History:  Diagnosis Date   Anxiety    Attention deficit hyperactivity disorder (ADHD), combined  type, moderate 04/12/2020   COVID-19    Persistent depressive disorder with atypical features, currently mild 03/08/2020   Persistent depressive disorder with atypical features, currently mild 03/08/2020    Family History  Problem Relation Age of Onset   Depression Mother    Anxiety disorder Mother    Depression Maternal Grandmother    Anxiety disorder Brother    Panic disorder Brother    Depression Other    Alcohol abuse Other    Arthritis Neg Hx    Asthma Neg Hx    Birth defects Neg Hx    Cancer Neg Hx    COPD Neg Hx    Diabetes Neg Hx    Drug abuse Neg Hx    Early death Neg Hx    Hearing loss Neg Hx    Heart disease Neg Hx    Hyperlipidemia Neg Hx    Learning disabilities Neg Hx    Kidney disease Neg Hx    Hypertension Neg Hx    Mental illness Neg Hx    Miscarriages / Stillbirths Neg Hx    Mental retardation Neg Hx    Stroke Neg Hx    Vision loss Neg Hx    Varicose Veins Neg Hx     Social History   Socioeconomic History   Marital status: Single    Spouse name: Not on file   Number of children: Not on file   Years of education: 14   Highest education level: High school graduate  Occupational History   Occupation: Seeking pro baseball.career  Tobacco Use   Smoking status: Never   Smokeless tobacco: Former  Building services engineer status: Never Used  Substance and Sexual Activity   Alcohol use: Not Currently    Comment: occasional   Drug use: Not Currently    Types: Marijuana   Sexual activity: Not on file  Other Topics Concern   Not on file  Social History Narrative   Scientist, 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  He describes restless legs in his sleep patterns having to get up for a hot bath or stretches to his legs in order to try to get back to sleep.  Despite longstanding Lexapro and new onset Klonopin, he fears and finds instability still in his illness pattern that undermines his performance in baseball and academics.  He leaves for college 05/18/2019 seeking intervention prior to then after PCP has recently started the Klonopin which helped but affecting tolerance side effects if dosed higher to prevent anxiety or panic.   Social Drivers of Corporate investment banker Strain: Not on file  Food Insecurity: No Food Insecurity (05/04/2019)   Hunger Vital Sign    Worried About Running Out of Food in the Last Year: Never true    Ran Out of Food in the Last Year:  Never true  Transportation Needs: No Transportation Needs (05/04/2019)   PRAPARE - Administrator, Civil Service (Medical): No    Lack of Transportation (Non-Medical): No  Physical Activity: Not on file  Stress: Stress Concern Present (05/04/2019)   [employer] of Occupational Health - Occupational Stress Questionnaire    Feeling of Stress : Rather much  Social Connections: Unknown (06/18/2023)   Received from [facility]   Social Network    Social Network: Not on file  Intimate Partner Violence: Unknown (06/18/2023)   Received from [facility]   HITS    Physically Hurt: Not on file    Insult or Talk Down To: Not on file    Threaten Physical Harm: Not on file    Scream or Curse: Not on file    Past Medical History, Surgical history, Social history, and Family history were reviewed and updated as appropriate.   Please see review of systems for further details on the patient's review from today.   Objective:   Physical Exam:  There were no vitals taken for this visit.  Physical Exam Constitutional:      General: He is not in acute distress. Musculoskeletal:        General: No deformity.   Neurological:     Mental Status: He is alert and oriented to person, place, and time.     Coordination: Coordination normal.  Psychiatric:        Attention and Perception: Attention and perception normal. He does not perceive auditory or visual hallucinations.        Mood and Affect: Mood normal. Affect is not labile, blunt, angry or inappropriate.        Speech: Speech normal.        Behavior: Behavior normal.        Thought Content: Thought content normal. Thought content is not paranoid or delusional. Thought content does not include homicidal or suicidal ideation. Thought content does not include homicidal or suicidal plan.        Cognition and Memory: Cognition and memory normal.        Judgment: Judgment normal.     Comments: Insight intact     Lab Review:     Component Value Date/Time   NA 138 05/09/2019 1329   K 3.9 05/09/2019 1329   CL 103 05/09/2019 1329   CO2 24 05/09/2019 1329   GLUCOSE 90 05/09/2019 1329   BUN 10 05/09/2019 1329   CREATININE 1.23 05/09/2019 1329   CALCIUM 9.6 05/09/2019 1329   PROT 7.4 04/08/2019 1836   ALBUMIN 4.6 04/08/2019 1836   AST 29 04/08/2019 1836   ALT 57 (H) 04/08/2019 1836   ALKPHOS 51 04/08/2019 1836   BILITOT 0.7 04/08/2019 1836   GFRNONAA >60 05/09/2019 1329   GFRAA >60 05/09/2019 1329       Component Value Date/Time   WBC 5.4 05/09/2019 1329   RBC 5.57 05/09/2019 1329   HGB 16.1 05/09/2019 1329   HCT 49.2 05/09/2019 1329   PLT 297 05/09/2019 1329   MCV 88.3 05/09/2019 1329   MCH 28.9 05/09/2019 1329   MCHC 32.7 05/09/2019 1329   RDW 13.2 05/09/2019 1329   LYMPHSABS 1.1 04/08/2019 1836   MONOABS 0.6 04/08/2019 1836   EOSABS 0.0 04/08/2019 1836   BASOSABS 0.0 04/08/2019 1836    No results found for: "POCLITH", "LITHIUM"   No results found for: "PHENYTOIN", "PHENOBARB", "VALPROATE", "CBMZ"   .res Assessment: Plan:   Plan:  PDMP reviewed  Vyvanse 50mg  daily Gabapentin 200mg  BID Lexapro 20mg  daily Adderall  10mg  daily as needed  Clonazepam - 1mg  BID - brand name  D/C Propranolol 10mg  twice daily as needed  25 minutes spent dedicated to the care of this patient on the date of this encounter to include pre-visit review of records, ordering of medication, post visit documentation, and face-to-face time with the patient discussing depression, anxiety, panic disorder and ADD. Discussed adding Propranolol to help manage anxiety.  Monitor BP between visits while taking stimulant medication.   RTC 3 months  Patient advised to contact office with any questions, adverse effects, or acute worsening in signs and symptoms.  Discussed potential benefits, risk, and side effects of benzodiazepines to include potential risk of tolerance and dependence, as well as possible drowsiness.  Advised patient not to drive if experiencing drowsiness and to take lowest possible effective dose to minimize risk of dependence and tolerance.  Discussed potential benefits, risks, and side effects of stimulants with patient to include increased heart rate, palpitations, insomnia, increased anxiety, increased irritability, or decreased appetite.  Instructed patient to contact office if experiencing any significant tolerability issues. There are no diagnoses linked to this encounter.   Please see After Visit Summary for patient specific instructions.  Future Appointments  Date Time Provider Department Center  01/10/2024 11:30 AM Juwon Scripter, Thereasa Solo, NP [facility]-CP None    No orders of the defined types were placed in this encounter.     -------------------------------

## 2024-01-17 ENCOUNTER — Other Ambulatory Visit (HOSPITAL_BASED_OUTPATIENT_CLINIC_OR_DEPARTMENT_OTHER): Payer: Self-pay

## 2024-02-18 ENCOUNTER — Other Ambulatory Visit (HOSPITAL_BASED_OUTPATIENT_CLINIC_OR_DEPARTMENT_OTHER): Payer: Self-pay

## 2024-02-18 ENCOUNTER — Other Ambulatory Visit (HOSPITAL_COMMUNITY): Payer: Self-pay

## 2024-02-29 ENCOUNTER — Other Ambulatory Visit: Payer: Self-pay | Admitting: Adult Health

## 2024-02-29 DIAGNOSIS — G2581 Restless legs syndrome: Secondary | ICD-10-CM

## 2024-02-29 DIAGNOSIS — F410 Panic disorder [episodic paroxysmal anxiety] without agoraphobia: Secondary | ICD-10-CM

## 2024-03-17 ENCOUNTER — Other Ambulatory Visit (HOSPITAL_BASED_OUTPATIENT_CLINIC_OR_DEPARTMENT_OTHER): Payer: Self-pay

## 2024-04-09 ENCOUNTER — Telehealth (INDEPENDENT_AMBULATORY_CARE_PROVIDER_SITE_OTHER): Admitting: Adult Health

## 2024-04-09 ENCOUNTER — Telehealth: Payer: Self-pay | Admitting: Adult Health

## 2024-04-09 ENCOUNTER — Other Ambulatory Visit: Payer: Self-pay

## 2024-04-09 DIAGNOSIS — Z0389 Encounter for observation for other suspected diseases and conditions ruled out: Secondary | ICD-10-CM

## 2024-04-09 DIAGNOSIS — G2581 Restless legs syndrome: Secondary | ICD-10-CM

## 2024-04-09 MED ORDER — AMPHETAMINE-DEXTROAMPHETAMINE 10 MG PO TABS: 10.0000 mg | ORAL_TABLET | Freq: Every day | ORAL | 0 refills | Status: AC

## 2024-04-09 NOTE — Telephone Encounter (Signed)
 Rx pended and sent to [facility] to submit

## 2024-04-09 NOTE — Progress Notes (Signed)
Patient no show appointment. Called and LVM.  

## 2024-04-09 NOTE — Telephone Encounter (Signed)
 Pt lvm requesting Rx for Adderall 10 mg [facility] [address] CONTACT # [phone]

## 2024-04-09 NOTE — Telephone Encounter (Signed)
 Pt's dad called at 3;48p.  His dad is on DPR.  Pt was supposed to have a visit today but cancelled due to being sick.  His dad says he has been without his Adderall for 3 days and called back to see if it would be sent today.  Next appt 7/16

## 2024-04-09 NOTE — Telephone Encounter (Signed)
 Noted will pend if appropriate.

## 2024-04-15 ENCOUNTER — Telehealth: Payer: Self-pay | Admitting: Adult Health

## 2024-04-15 ENCOUNTER — Other Ambulatory Visit: Payer: Self-pay

## 2024-04-15 ENCOUNTER — Ambulatory Visit (INDEPENDENT_AMBULATORY_CARE_PROVIDER_SITE_OTHER): Admitting: Adult Health

## 2024-04-15 DIAGNOSIS — Z0389 Encounter for observation for other suspected diseases and conditions ruled out: Secondary | ICD-10-CM

## 2024-04-15 DIAGNOSIS — F902 Attention-deficit hyperactivity disorder, combined type: Secondary | ICD-10-CM

## 2024-04-15 NOTE — Telephone Encounter (Signed)
 Pended.

## 2024-04-15 NOTE — Telephone Encounter (Signed)
 Was late for his appt today on 7/16. Requesting Vyvanse  50 mg sent to:  [facility]   Phone: [phone]  Fax: [phone]

## 2024-04-15 NOTE — Progress Notes (Signed)
 Patient no show appointment. ? ?

## 2024-04-16 ENCOUNTER — Other Ambulatory Visit (HOSPITAL_BASED_OUTPATIENT_CLINIC_OR_DEPARTMENT_OTHER): Payer: Self-pay

## 2024-04-16 MED ORDER — LISDEXAMFETAMINE DIMESYLATE 50 MG PO CAPS
50.0000 mg | ORAL_CAPSULE | Freq: Every day | ORAL | 0 refills | Status: DC
  Filled 2024-04-16: qty 15, 15d supply, fill #0

## 2024-04-17 ENCOUNTER — Other Ambulatory Visit (HOSPITAL_BASED_OUTPATIENT_CLINIC_OR_DEPARTMENT_OTHER): Payer: Self-pay

## 2024-04-19 ENCOUNTER — Other Ambulatory Visit: Payer: Self-pay | Admitting: Adult Health

## 2024-04-23 ENCOUNTER — Other Ambulatory Visit: Payer: Self-pay

## 2024-04-23 ENCOUNTER — Telehealth: Payer: Self-pay | Admitting: Adult Health

## 2024-04-23 DIAGNOSIS — G2581 Restless legs syndrome: Secondary | ICD-10-CM

## 2024-04-23 DIAGNOSIS — F410 Panic disorder [episodic paroxysmal anxiety] without agoraphobia: Secondary | ICD-10-CM

## 2024-04-23 MED ORDER — GABAPENTIN 100 MG PO CAPS: 200.0000 mg | ORAL_CAPSULE | Freq: Two times a day (BID) | ORAL | 0 refills | Status: DC

## 2024-04-23 NOTE — Telephone Encounter (Signed)
 Pt called in needing refill of gabapentin      [facility] [address]  Lucas Sherman

## 2024-04-23 NOTE — Telephone Encounter (Signed)
 I see you sent 120 pills or 30 days worth based on him taking 4 pills a day, but his message sounded like he is wanting a total of 360 pills (or 3 months worth).

## 2024-04-23 NOTE — Telephone Encounter (Signed)
 A 90-day supply was sent to [facility] May 31. Pt notified.

## 2024-04-23 NOTE — Telephone Encounter (Signed)
 Patient lvm today at 3:24 RTC. Patient stated directions indicated 180 capsules 2 twice a day. He stated he didn't get a 3 mos supply the reason he is currently out. Please advise.

## 2024-04-23 NOTE — Telephone Encounter (Signed)
 Sent 30-day supply of gabapentin , past due for FU.

## 2024-05-01 ENCOUNTER — Encounter: Payer: Self-pay | Admitting: Adult Health

## 2024-05-01 ENCOUNTER — Telehealth: Admitting: Adult Health

## 2024-05-01 ENCOUNTER — Other Ambulatory Visit (HOSPITAL_BASED_OUTPATIENT_CLINIC_OR_DEPARTMENT_OTHER): Payer: Self-pay

## 2024-05-01 DIAGNOSIS — F410 Panic disorder [episodic paroxysmal anxiety] without agoraphobia: Secondary | ICD-10-CM

## 2024-05-01 DIAGNOSIS — F341 Dysthymic disorder: Secondary | ICD-10-CM

## 2024-05-01 DIAGNOSIS — F411 Generalized anxiety disorder: Secondary | ICD-10-CM | POA: Diagnosis not present

## 2024-05-01 DIAGNOSIS — F32A Depression, unspecified: Secondary | ICD-10-CM

## 2024-05-01 DIAGNOSIS — F909 Attention-deficit hyperactivity disorder, unspecified type: Secondary | ICD-10-CM | POA: Diagnosis not present

## 2024-05-01 DIAGNOSIS — G2581 Restless legs syndrome: Secondary | ICD-10-CM

## 2024-05-01 DIAGNOSIS — F902 Attention-deficit hyperactivity disorder, combined type: Secondary | ICD-10-CM

## 2024-05-01 MED ORDER — LISDEXAMFETAMINE DIMESYLATE 50 MG PO CAPS
50.0000 mg | ORAL_CAPSULE | Freq: Every day | ORAL | 0 refills | Status: DC
Start: 2024-05-01 — End: 2024-08-07
  Filled 2024-05-01: qty 30, 30d supply, fill #0

## 2024-05-01 MED ORDER — LISDEXAMFETAMINE DIMESYLATE 50 MG PO CAPS
50.0000 mg | ORAL_CAPSULE | Freq: Every day | ORAL | 0 refills | Status: DC
  Filled 2024-06-04: qty 30, 30d supply, fill #0

## 2024-05-01 NOTE — Progress Notes (Signed)
 Lucas Sherman [id number] [birth year] 23 y.o.  Virtual Visit via Video Note  I connected with pt @ on 05/01/24 at 10:30 AM EDT by a video enabled telemedicine application and verified that I am speaking with the correct person using two identifiers.   I discussed the limitations of evaluation and management by telemedicine and the availability of in person appointments. The patient expressed understanding and agreed to proceed.  I discussed the assessment and treatment plan with the patient. The patient was provided an opportunity to ask questions and all were answered. The patient agreed with the plan and demonstrated an understanding of the instructions.   The patient was advised to call back or seek an in-person evaluation if the symptoms worsen or if the condition fails to improve as anticipated.  I provided 25 minutes of non-face-to-face time during this encounter.  The patient was located at home.  The provider was located at [facility].   Angeline LOISE Sayers, NP   Subjective:   Patient ID:  Lucas Sherman is a 23 y.o. (DOB 2001/01/06) male.  Chief Complaint: No chief complaint on file.   HPI Lucas Sherman presents for follow-up of ADHD, Panic disorder, RLS, Depression, GAD.  Describes mood today as ok. Pleasant. Denies tearfulness. Mood symptoms - denies depression, anxiety and irritability. Reports stable interest and motivation. Reports panic episodes - but not attacks. Denies worry, rumination and over thinking. Denies obsessive thoughts or acts. Reports mood is stable. Stating I feel like I'm doing ok. Feels like medications are helpful. Taking medications as presribed.  Energy levels stable. Active, has a regular exercise routine.   Enjoys some usual interests and activities. Single - not dating. Spending time with family. Appetite adequate. Weight stable - 178 pounds - 68. Sleeps well most nights. Averages 7 to 8 or more hours. Reports focus and  concentration difficulties at times. Completing tasks. Managing aspects of household. Taking college classes. Playing baseball - [employer] Denies SI or HI.  Denies AH or VH. Denies self harm. Denies substance use.   Previous medication trials: Denies  Review of Systems:  Review of Systems  Musculoskeletal:  Negative for gait problem.  Neurological:  Negative for tremors.  Psychiatric/Behavioral:         Please refer to HPI    Medications: I have reviewed the patient's current medications.  Current Outpatient Medications  Medication Sig Dispense Refill   amphetamine -dextroamphetamine  (ADDERALL) 10 MG tablet Take 1 tablet (10 mg total) by mouth daily. 30 tablet 0   amphetamine -dextroamphetamine  (ADDERALL) 10 MG tablet Take 1 tablet (10 mg total) by mouth daily. 30 tablet 0   amphetamine -dextroamphetamine  (ADDERALL) 10 MG tablet Take 1 tablet (10 mg total) by mouth daily. 30 tablet 0   clonazePAM  (KLONOPIN ) 1 MG tablet Take 1 tablet (1 mg total) by mouth 2 (two) times daily. 60 tablet 2   escitalopram  (LEXAPRO ) 20 MG tablet Take 1 tablet (20 mg total) by mouth daily. 90 tablet 1   gabapentin  (NEURONTIN ) 100 MG capsule Take 2 capsules (200 mg total) by mouth 2 (two) times daily. 120 capsule 0   lisdexamfetamine (VYVANSE ) 50 MG capsule Take 1 capsule (50 mg total) by mouth daily after breakfast. 30 capsule 0   lisdexamfetamine (VYVANSE ) 50 MG capsule Take 1 capsule (50 mg total) by mouth daily after breakfast. 30 capsule 0   lisdexamfetamine (VYVANSE ) 50 MG capsule Take 1 capsule (50 mg total) by mouth daily after breakfast for 15 days. 15 capsule 0  No current facility-administered medications for this visit.    Medication Side Effects: None  Allergies:  Allergies  Allergen Reactions   Metoclopramide  Anxiety    anxiety    Past Medical History:  Diagnosis Date   Anxiety    Attention deficit hyperactivity disorder (ADHD), combined type, moderate 04/12/2020    COVID-19    Persistent depressive disorder with atypical features, currently mild 03/08/2020   Persistent depressive disorder with atypical features, currently mild 03/08/2020    Family History  Problem Relation Age of Onset   Depression Mother    Anxiety disorder Mother    Depression Maternal Grandmother    Anxiety disorder Brother    Panic disorder Brother    Depression Other    Alcohol abuse Other    Arthritis Neg Hx    Asthma Neg Hx    Birth defects Neg Hx    Cancer Neg Hx    COPD Neg Hx    Diabetes Neg Hx    Drug abuse Neg Hx    Early death Neg Hx    Hearing loss Neg Hx    Heart disease Neg Hx    Hyperlipidemia Neg Hx    Learning disabilities Neg Hx    Kidney disease Neg Hx    Hypertension Neg Hx    Mental illness Neg Hx    Miscarriages / Stillbirths Neg Hx    Mental retardation Neg Hx    Stroke Neg Hx    Vision loss Neg Hx    Varicose Veins Neg Hx     Social History   Socioeconomic History   Marital status: Single    Spouse name: Not on file   Number of children: Not on file   Years of education: 14   Highest education level: High school graduate  Occupational History   Occupation: Seeking pro baseball.career  Tobacco Use   Smoking status: Never   Smokeless tobacco: Former  Building services engineer status: Never Used  Substance and Sexual Activity   Alcohol use: Not Currently    Comment: occasional   Drug use: Not Currently    Types: Marijuana   Sexual activity: Not on file  Other Topics Concern   Not on file  Social History Narrative   Scientist, 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  He describes restless  legs in his sleep patterns having to get up for a hot bath or stretches to his legs in order to try to get back to sleep.  Despite longstanding Lexapro  and new onset Klonopin , he fears and finds instability still in his illness pattern that undermines his performance in baseball and academics.  He leaves for college 05/18/2019 seeking intervention prior to then after PCP has recently started the Klonopin  which helped but affecting tolerance side effects if dosed higher to prevent anxiety or panic.   Social Drivers of Corporate investment banker Strain: Not on file  Food Insecurity: No Food Insecurity (05/04/2019)   Hunger Vital Sign    Worried About Running Out of Food in the Last Year: Never true    Ran Out of Food in the Last Year: Never true  Transportation Needs: No Transportation Needs (05/04/2019)   PRAPARE -  Administrator, Civil Service (Medical): No    Lack of Transportation (Non-Medical): No  Physical Activity: Not on file  Stress: Stress Concern Present (05/04/2019)   [employer] of Occupational Health - Occupational Stress Questionnaire    Feeling of Stress : Rather much  Social Connections: Unknown (06/18/2023)   [facility]   Social Network    Social Network: Not on file  Intimate Partner Violence: Unknown (06/18/2023)   Received from [facility]   HITS    Physically Hurt: Not on file    Insult or Talk Down To: Not on file    Threaten Physical Harm: Not on file    Scream or Curse: Not on file    Past Medical History, Surgical history, Social history, and Family history were reviewed and updated as appropriate.   Please see review of systems for further details on the patient's review from today.   Objective:   Physical Exam:  There were no vitals taken for this visit.  Physical Exam Constitutional:      General: He is not in acute distress. Musculoskeletal:        General: No deformity.  Neurological:     Mental Status: He is alert  and oriented to person, place, and time.     Coordination: Coordination normal.  Psychiatric:        Attention and Perception: Attention and perception normal. He does not perceive auditory or visual hallucinations.        Mood and Affect: Mood normal. Mood is not anxious or depressed. Affect is not labile, blunt, angry or inappropriate.        Speech: Speech normal.        Behavior: Behavior normal.        Thought Content: Thought content normal. Thought content is not paranoid or delusional. Thought content does not include homicidal or suicidal ideation. Thought content does not include homicidal or suicidal plan.        Cognition and Memory: Cognition and memory normal.        Judgment: Judgment normal.     Comments: Insight intact     Lab Review:     Component Value Date/Time   NA 138 05/09/2019 1329   K 3.9 05/09/2019 1329   CL 103 05/09/2019 1329   CO2 24 05/09/2019 1329   GLUCOSE 90 05/09/2019 1329   BUN 10 05/09/2019 1329   CREATININE 1.23 05/09/2019 1329   CALCIUM 9.6 05/09/2019 1329   PROT 7.4 04/08/2019 1836   ALBUMIN 4.6 04/08/2019 1836   AST 29 04/08/2019 1836   ALT 57 (H) 04/08/2019 1836   ALKPHOS 51 04/08/2019 1836   BILITOT 0.7 04/08/2019 1836   GFRNONAA >60 05/09/2019 1329   GFRAA >60 05/09/2019 1329       Component Value Date/Time   WBC 5.4 05/09/2019 1329   RBC 5.57 05/09/2019 1329   HGB 16.1 05/09/2019 1329   HCT 49.2 05/09/2019 1329   PLT 297 05/09/2019 1329   MCV 88.3 05/09/2019 1329   MCH 28.9 05/09/2019 1329   MCHC 32.7 05/09/2019 1329   RDW 13.2 05/09/2019 1329   LYMPHSABS 1.1 04/08/2019 1836   MONOABS 0.6 04/08/2019 1836   EOSABS 0.0 04/08/2019 1836   BASOSABS 0.0 04/08/2019 1836    No results found for: POCLITH, LITHIUM   No results found for: PHENYTOIN, PHENOBARB, VALPROATE, CBMZ   .res Assessment: Plan:    Plan:  PDMP reviewed  Vyvanse  50mg  daily Gabapentin  200mg   BID Lexapro  20mg  daily Clonazepam  - 1mg  BID -  brand name  Propranolol  10mg  twice daily as needed  Adderall 10mg  daily as needed  25 minutes spent dedicated to the care of this patient on the date of this encounter to include pre-visit review of records, ordering of medication, post visit documentation, and face-to-face time with the patient discussing depression, anxiety, panic disorder and ADD. Discussed adding  Propranolol  to help manage anxiety.  Monitor BP between visits while taking stimulant medication.   RTC 4 weeks   Patient advised to contact office with any questions, adverse effects, or acute worsening in signs and symptoms.  Discussed potential benefits, risk, and side effects of benzodiazepines to include potential risk of tolerance and dependence, as well as possible drowsiness.  Advised patient not to drive if experiencing drowsiness and to take lowest possible effective dose to minimize risk of dependence and tolerance.  Discussed potential benefits, risks, and side effects of stimulants with patient to include increased heart rate, palpitations, insomnia, increased anxiety, increased irritability, or decreased appetite.  Instructed patient to contact office if experiencing any significant tolerability issues.  There are no diagnoses linked to this encounter.   Please see After Visit Summary for patient specific instructions.  Future Appointments  Date Time Provider Department Center  05/01/2024 10:30 AM Siena Poehler Nattalie, NP CP-CP None    No orders of the defined types were placed in this encounter.     -------------------------------

## 2024-05-17 ENCOUNTER — Other Ambulatory Visit: Payer: Self-pay | Admitting: Adult Health

## 2024-05-17 DIAGNOSIS — F411 Generalized anxiety disorder: Secondary | ICD-10-CM

## 2024-05-26 ENCOUNTER — Telehealth: Payer: Self-pay | Admitting: Adult Health

## 2024-05-26 ENCOUNTER — Other Ambulatory Visit: Payer: Self-pay

## 2024-05-26 DIAGNOSIS — F902 Attention-deficit hyperactivity disorder, combined type: Secondary | ICD-10-CM

## 2024-05-26 MED ORDER — AMPHETAMINE-DEXTROAMPHETAMINE 10 MG PO TABS: 10.0000 mg | ORAL_TABLET | Freq: Every day | ORAL | 0 refills | Status: DC

## 2024-05-26 NOTE — Telephone Encounter (Signed)
 Patient called in for refill on Adderall 10mg . Pharmacy informed him he doesn't have anymore prescriptions on file and needs one sent [facility] [address] [place], [place] Ph: [phone]

## 2024-05-26 NOTE — Telephone Encounter (Signed)
 Pended

## 2024-05-27 ENCOUNTER — Other Ambulatory Visit: Payer: Self-pay | Admitting: Adult Health

## 2024-05-27 DIAGNOSIS — G2581 Restless legs syndrome: Secondary | ICD-10-CM

## 2024-05-27 DIAGNOSIS — F410 Panic disorder [episodic paroxysmal anxiety] without agoraphobia: Secondary | ICD-10-CM

## 2024-06-04 ENCOUNTER — Other Ambulatory Visit (HOSPITAL_BASED_OUTPATIENT_CLINIC_OR_DEPARTMENT_OTHER): Payer: Self-pay

## 2024-06-30 ENCOUNTER — Other Ambulatory Visit (HOSPITAL_BASED_OUTPATIENT_CLINIC_OR_DEPARTMENT_OTHER): Payer: Self-pay

## 2024-06-30 ENCOUNTER — Other Ambulatory Visit: Payer: Self-pay | Admitting: Adult Health

## 2024-06-30 DIAGNOSIS — F902 Attention-deficit hyperactivity disorder, combined type: Secondary | ICD-10-CM

## 2024-06-30 NOTE — Telephone Encounter (Signed)
    Pt needs rf Adderall [id number] [place], [place] - [address] AT [facility] [address], [place]  [id number]       Pt needs Vyvanse  sent to  [facility]  [address], [place] [address]

## 2024-07-01 ENCOUNTER — Other Ambulatory Visit: Payer: Self-pay

## 2024-07-01 DIAGNOSIS — F902 Attention-deficit hyperactivity disorder, combined type: Secondary | ICD-10-CM

## 2024-07-01 MED ORDER — AMPHETAMINE-DEXTROAMPHETAMINE 10 MG PO TABS
10.0000 mg | ORAL_TABLET | Freq: Every day | ORAL | 0 refills | Status: DC
Start: 2024-07-01 — End: 2024-08-05

## 2024-07-01 NOTE — Telephone Encounter (Signed)
 Pended Adderall, Vyvanse  due 10/2.

## 2024-07-02 ENCOUNTER — Other Ambulatory Visit (HOSPITAL_BASED_OUTPATIENT_CLINIC_OR_DEPARTMENT_OTHER): Payer: Self-pay

## 2024-07-02 MED ORDER — LISDEXAMFETAMINE DIMESYLATE 50 MG PO CAPS
50.0000 mg | ORAL_CAPSULE | Freq: Every day | ORAL | 0 refills | Status: DC
  Filled 2024-07-02: qty 30, 30d supply, fill #0

## 2024-08-04 ENCOUNTER — Other Ambulatory Visit: Payer: Self-pay

## 2024-08-04 ENCOUNTER — Telehealth: Payer: Self-pay | Admitting: Adult Health

## 2024-08-04 NOTE — Telephone Encounter (Signed)
 Pt's mom LVM @ 1:38p.  She is on DPR.  She is requesting refill of Adderall to   [id number] [place], [place] - [address] AT [facility] [address], [place] [address] Phone: [phone]  Fax: [phone]   No upcoming appts

## 2024-08-04 NOTE — Telephone Encounter (Signed)
 Due now for FU. Sent Mychart message to schedule.

## 2024-08-05 ENCOUNTER — Telehealth (INDEPENDENT_AMBULATORY_CARE_PROVIDER_SITE_OTHER): Admitting: Adult Health

## 2024-08-05 ENCOUNTER — Encounter: Payer: Self-pay | Admitting: Adult Health

## 2024-08-05 ENCOUNTER — Telehealth: Payer: Self-pay | Admitting: Adult Health

## 2024-08-05 DIAGNOSIS — F32A Depression, unspecified: Secondary | ICD-10-CM

## 2024-08-05 DIAGNOSIS — F909 Attention-deficit hyperactivity disorder, unspecified type: Secondary | ICD-10-CM

## 2024-08-05 DIAGNOSIS — F902 Attention-deficit hyperactivity disorder, combined type: Secondary | ICD-10-CM

## 2024-08-05 DIAGNOSIS — G2581 Restless legs syndrome: Secondary | ICD-10-CM | POA: Diagnosis not present

## 2024-08-05 DIAGNOSIS — F410 Panic disorder [episodic paroxysmal anxiety] without agoraphobia: Secondary | ICD-10-CM | POA: Diagnosis not present

## 2024-08-05 DIAGNOSIS — F341 Dysthymic disorder: Secondary | ICD-10-CM

## 2024-08-05 DIAGNOSIS — F411 Generalized anxiety disorder: Secondary | ICD-10-CM

## 2024-08-05 MED ORDER — AMPHETAMINE-DEXTROAMPHETAMINE 10 MG PO TABS: 10.0000 mg | ORAL_TABLET | Freq: Every day | ORAL | 0 refills | Status: DC

## 2024-08-05 MED ORDER — AMPHETAMINE-DEXTROAMPHETAMINE 10 MG PO TABS
10.0000 mg | ORAL_TABLET | Freq: Every day | ORAL | 0 refills | Status: DC
Start: 2024-09-30 — End: 2024-08-05

## 2024-08-05 NOTE — Addendum Note (Signed)
 Addended by: LAWERANCE ANGELINE SAILOR on: 08/05/2024 01:16 PM   Modules accepted: Orders

## 2024-08-05 NOTE — Telephone Encounter (Signed)
 Pt called and said that gina sent his adderall in for December but he needs a script for November sent to the same pharmacy

## 2024-08-05 NOTE — Progress Notes (Signed)
 SAID RUEB [id number] 05/27/2001 23 y.o.  Virtual Visit via Video Note  I connected with pt @ on 25/05/25 at 11:30 AM EST by a video enabled telemedicine application and verified that I am speaking with the correct person using two identifiers.   I discussed the limitations of evaluation and management by telemedicine and the availability of in person appointments. The patient expressed understanding and agreed to proceed.  I discussed the assessment and treatment plan with the patient. The patient was provided an opportunity to ask questions and all were answered. The patient agreed with the plan and demonstrated an understanding of the instructions.   The patient was advised to call back or seek an in-person evaluation if the symptoms worsen or if the condition fails to improve as anticipated.  I provided 25 minutes of non-face-to-face time during this encounter.  The patient was located at home.  The provider was located at [facility].   Angeline LOISE Sayers, NP   Subjective:   Patient ID:  Lucas Sherman is a 23 y.o. (DOB [birth year]) male.  Chief Complaint: No chief complaint on file.   HPI Lucas Sherman presents for follow-up of ADHD, Panic disorder, RLS, Depression, GAD.  Describes mood today as ok. Pleasant. Denies tearfulness. Mood symptoms - denies depression, anxiety and irritability. Reports stable interest and motivation. Reports panic episodes - coping better. Denies worry, rumination and over thinking. Denies obsessive thoughts or acts. Reports mood is stable. Stating I feel like I'm doing alright. Feels like medications are helpful. Taking medications as presribed.  Energy levels stable. Active, has a regular exercise routine.   Enjoys some usual interests and activities. Single. Spending time with family. Appetite adequate. Weight stable - 178 pounds - 68. Sleeps well most nights. Averages 7 to 8 or more hours. Reports focus and concentration  difficulties at times. Completing tasks. Managing aspects of household. Taking college classes. Playing baseball - [employer]. Denies SI or HI.  Denies AH or VH. Denies self harm. Denies substance use.   Previous medication trials: Denies   Review of Systems:  Review of Systems  Musculoskeletal:  Negative for gait problem.  Neurological:  Negative for tremors.  Psychiatric/Behavioral:         Please refer to HPI    Medications: I have reviewed the patient's current medications.  Current Outpatient Medications  Medication Sig Dispense Refill   amphetamine -dextroamphetamine  (ADDERALL) 10 MG tablet Take 1 tablet (10 mg total) by mouth daily. 30 tablet 0   amphetamine -dextroamphetamine  (ADDERALL) 10 MG tablet Take 1 tablet (10 mg total) by mouth daily. 30 tablet 0   amphetamine -dextroamphetamine  (ADDERALL) 10 MG tablet Take 1 tablet (10 mg total) by mouth daily. 30 tablet 0   clonazePAM  (KLONOPIN ) 1 MG tablet TAKE 1 TABLET BY MOUTH TWICE A DAY 60 tablet 2   escitalopram  (LEXAPRO ) 20 MG tablet Take 1 tablet (20 mg total) by mouth daily. 90 tablet 1   gabapentin  (NEURONTIN ) 100 MG capsule TAKE 2 CAPSULES BY MOUTH 2 TIMES DAILY. 120 capsule 2   lisdexamfetamine (VYVANSE ) 50 MG capsule Take 1 capsule (50 mg total) by mouth daily after breakfast. 30 capsule 0   lisdexamfetamine (VYVANSE ) 50 MG capsule Take 1 capsule (50 mg total) by mouth daily after breakfast. 30 capsule 0   lisdexamfetamine (VYVANSE ) 50 MG capsule Take 1 capsule (50 mg total) by mouth daily after breakfast. 30 capsule 0   No current facility-administered medications for this visit.    Medication Side Effects:  None  Allergies:  Allergies  Allergen Reactions   Metoclopramide  Anxiety    anxiety    Past Medical History:  Diagnosis Date   Anxiety    Attention deficit hyperactivity disorder (ADHD), combined type, moderate 04/12/2020   COVID-19    Persistent depressive disorder with atypical features,  currently mild 03/08/2020   Persistent depressive disorder with atypical features, currently mild 03/08/2020    Family History  Problem Relation Age of Onset   Depression Mother    Anxiety disorder Mother    Depression Maternal Grandmother    Anxiety disorder Brother    Panic disorder Brother    Depression Other    Alcohol abuse Other    Arthritis Neg Hx    Asthma Neg Hx    Birth defects Neg Hx    Cancer Neg Hx    COPD Neg Hx    Diabetes Neg Hx    Drug abuse Neg Hx    Early death Neg Hx    Hearing loss Neg Hx    Heart disease Neg Hx    Hyperlipidemia Neg Hx    Learning disabilities Neg Hx    Kidney disease Neg Hx    Hypertension Neg Hx    Mental illness Neg Hx    Miscarriages / Stillbirths Neg Hx    Mental retardation Neg Hx    Stroke Neg Hx    Vision loss Neg Hx    Varicose Veins Neg Hx     Social History   Socioeconomic History   Marital status: Single    Spouse name: Not on file   Number of children: Not on file   Years of education: 14   Highest education level: High school graduate  Occupational History   Occupation: Seeking pro baseball.career  Tobacco Use   Smoking status: Never   Smokeless tobacco: Former  Building Services Engineer status: Never Used  Substance and Sexual Activity   Alcohol use: Not Currently    Comment: occasional   Drug use: Not Currently    Types: Marijuana   Sexual activity: Not on file  Other Topics Concern   Not on file  Social History Narrative   Scientist, 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  He describes restless legs in his sleep patterns having to get up for a hot bath or stretches  to his legs in order to try to get back to sleep.  Despite longstanding Lexapro  and new onset Klonopin , he fears and finds instability still in his illness pattern that undermines his performance in baseball and academics.  He leaves for college 05/18/2019 seeking intervention prior to then after PCP has recently started the Klonopin  which helped but affecting tolerance side effects if dosed higher to prevent anxiety or panic.   Social Drivers of Corporate Investment Banker Strain: Not on file  Food Insecurity: No Food Insecurity (05/04/2019)   Hunger Vital Sign    Worried About Running Out of Food in the Last Year: Never true    Ran Out of Food in the Last Year: Never true  Transportation Needs: No Transportation Needs (05/04/2019)   PRAPARE - Administrator, Civil Service (Medical): No  Lack of Transportation (Non-Medical): No  Physical Activity: Not on file  Stress: Stress Concern Present (05/04/2019)   [employer] of Occupational Health - Occupational Stress Questionnaire    Feeling of Stress : Rather much  Social Connections: Unknown (06/18/2023)   Received from [facility]   Social Network    Social Network: Not on file  Intimate Partner Violence: Unknown (06/18/2023)   Received from [facility]   HITS    Physically Hurt: Not on file    Insult or Talk Down To: Not on file    Threaten Physical Harm: Not on file    Scream or Curse: Not on file    Past Medical History, Surgical history, Social history, and Family history were reviewed and updated as appropriate.   Please see review of systems for further details on the patient's review from today.   Objective:   Physical Exam:  There were no vitals taken for this visit.  Physical Exam Constitutional:      General: He is not in acute distress. Musculoskeletal:        General: No deformity.  Neurological:     Mental Status: He is alert and oriented to person, place, and time.     Coordination: Coordination  normal.  Psychiatric:        Attention and Perception: Attention and perception normal. He does not perceive auditory or visual hallucinations.        Mood and Affect: Mood normal. Mood is not anxious or depressed. Affect is not labile, blunt, angry or inappropriate.        Speech: Speech normal.        Behavior: Behavior normal.        Thought Content: Thought content normal. Thought content is not paranoid or delusional. Thought content does not include homicidal or suicidal ideation. Thought content does not include homicidal or suicidal plan.        Cognition and Memory: Cognition and memory normal.        Judgment: Judgment normal.     Comments: Insight intact     Lab Review:     Component Value Date/Time   NA 138 05/09/2019 1329   K 3.9 05/09/2019 1329   CL 103 05/09/2019 1329   CO2 24 05/09/2019 1329   GLUCOSE 90 05/09/2019 1329   BUN 10 05/09/2019 1329   CREATININE 1.23 05/09/2019 1329   CALCIUM 9.6 05/09/2019 1329   PROT 7.4 04/08/2019 1836   ALBUMIN 4.6 04/08/2019 1836   AST 29 04/08/2019 1836   ALT 57 (H) 04/08/2019 1836   ALKPHOS 51 04/08/2019 1836   BILITOT 0.7 04/08/2019 1836   GFRNONAA >60 05/09/2019 1329   GFRAA >60 05/09/2019 1329       Component Value Date/Time   WBC 5.4 05/09/2019 1329   RBC 5.57 05/09/2019 1329   HGB 16.1 05/09/2019 1329   HCT 49.2 05/09/2019 1329   PLT 297 05/09/2019 1329   MCV 88.3 05/09/2019 1329   MCH 28.9 05/09/2019 1329   MCHC 32.7 05/09/2019 1329   RDW 13.2 05/09/2019 1329   LYMPHSABS 1.1 04/08/2019 1836   MONOABS 0.6 04/08/2019 1836   EOSABS 0.0 04/08/2019 1836   BASOSABS 0.0 04/08/2019 1836    No results found for: POCLITH, LITHIUM   No results found for: PHENYTOIN, PHENOBARB, VALPROATE, CBMZ   .res Assessment: Plan:    Plan:  PDMP reviewed  Vyvanse  50mg  daily Gabapentin  200mg  BID Lexapro  20mg  daily Clonazepam  - 1mg  BID - brand name Propranolol   10mg  twice daily as needed Adderall 10mg  daily as  needed  25 minutes spent dedicated to the care of this patient on the date of this encounter to include pre-visit review of records, ordering of medication, post visit documentation, and face-to-face time with the patient discussing depression, anxiety, panic disorder and ADD. Discussed adding  Propranolol  to help manage anxiety.  Monitor BP between visits while taking stimulant medication.   RTC 3 months  Patient advised to contact office with any questions, adverse effects, or acute worsening in signs and symptoms.  Discussed potential benefits, risk, and side effects of benzodiazepines to include potential risk of tolerance and dependence, as well as possible drowsiness.  Advised patient not to drive if experiencing drowsiness and to take lowest possible effective dose to minimize risk of dependence and tolerance.  Discussed potential benefits, risks, and side effects of stimulants with patient to include increased heart rate, palpitations, insomnia, increased anxiety, increased irritability, or decreased appetite.  Instructed patient to contact office if experiencing any significant tolerability issues.  There are no diagnoses linked to this encounter.   Please see After Visit Summary for patient specific instructions.  Future Appointments  Date Time Provider Department Center  08/05/2024 11:30 AM Shaw Dobek Nattalie, NP CP-CP None    No orders of the defined types were placed in this encounter.     -------------------------------

## 2024-08-05 NOTE — Telephone Encounter (Signed)
Pt saw Gina today.

## 2024-08-05 NOTE — Telephone Encounter (Signed)
 Rx was sent

## 2024-08-07 ENCOUNTER — Other Ambulatory Visit: Payer: Self-pay | Admitting: Adult Health

## 2024-08-07 ENCOUNTER — Other Ambulatory Visit (HOSPITAL_BASED_OUTPATIENT_CLINIC_OR_DEPARTMENT_OTHER): Payer: Self-pay

## 2024-08-07 ENCOUNTER — Other Ambulatory Visit: Payer: Self-pay

## 2024-08-07 DIAGNOSIS — F902 Attention-deficit hyperactivity disorder, combined type: Secondary | ICD-10-CM

## 2024-08-07 MED ORDER — LISDEXAMFETAMINE DIMESYLATE 50 MG PO CAPS
50.0000 mg | ORAL_CAPSULE | Freq: Every day | ORAL | 0 refills | Status: AC
  Filled 2024-10-13: qty 30, 30d supply, fill #0

## 2024-08-07 MED ORDER — LISDEXAMFETAMINE DIMESYLATE 50 MG PO CAPS
50.0000 mg | ORAL_CAPSULE | Freq: Every day | ORAL | 0 refills | Status: AC
  Filled 2024-08-07: qty 30, 30d supply, fill #0

## 2024-08-07 MED ORDER — LISDEXAMFETAMINE DIMESYLATE 50 MG PO CAPS
50.0000 mg | ORAL_CAPSULE | Freq: Every day | ORAL | 0 refills | Status: AC
  Filled 2024-09-08: qty 30, 30d supply, fill #0

## 2024-08-15 ENCOUNTER — Other Ambulatory Visit: Payer: Self-pay | Admitting: Adult Health

## 2024-08-15 DIAGNOSIS — F410 Panic disorder [episodic paroxysmal anxiety] without agoraphobia: Secondary | ICD-10-CM

## 2024-08-15 DIAGNOSIS — F411 Generalized anxiety disorder: Secondary | ICD-10-CM

## 2024-08-15 DIAGNOSIS — F341 Dysthymic disorder: Secondary | ICD-10-CM

## 2024-09-05 ENCOUNTER — Other Ambulatory Visit: Payer: Self-pay | Admitting: Adult Health

## 2024-09-05 DIAGNOSIS — G2581 Restless legs syndrome: Secondary | ICD-10-CM

## 2024-09-05 DIAGNOSIS — F410 Panic disorder [episodic paroxysmal anxiety] without agoraphobia: Secondary | ICD-10-CM

## 2024-09-08 ENCOUNTER — Other Ambulatory Visit (HOSPITAL_BASED_OUTPATIENT_CLINIC_OR_DEPARTMENT_OTHER): Payer: Self-pay

## 2024-10-02 ENCOUNTER — Other Ambulatory Visit: Payer: Self-pay

## 2024-10-02 ENCOUNTER — Telehealth: Payer: Self-pay | Admitting: Adult Health

## 2024-10-02 DIAGNOSIS — G2581 Restless legs syndrome: Secondary | ICD-10-CM

## 2024-10-02 DIAGNOSIS — F902 Attention-deficit hyperactivity disorder, combined type: Secondary | ICD-10-CM

## 2024-10-02 MED ORDER — AMPHETAMINE-DEXTROAMPHETAMINE 10 MG PO TABS: 10.0000 mg | ORAL_TABLET | Freq: Every day | ORAL | 0 refills | Status: AC

## 2024-10-02 NOTE — Telephone Encounter (Signed)
"  Pended   "

## 2024-10-02 NOTE — Telephone Encounter (Signed)
 Lucas Sherman called and said that he needs a refill on his adderall 10 mg. Pharmacy is [facility] at [address] in [place], [place]

## 2024-10-13 ENCOUNTER — Other Ambulatory Visit (HOSPITAL_BASED_OUTPATIENT_CLINIC_OR_DEPARTMENT_OTHER): Payer: Self-pay

## 2024-11-05 ENCOUNTER — Encounter: Payer: Self-pay | Admitting: Adult Health

## 2024-11-05 ENCOUNTER — Telehealth: Payer: Self-pay | Admitting: Adult Health

## 2024-11-05 DIAGNOSIS — Z0389 Encounter for observation for other suspected diseases and conditions ruled out: Secondary | ICD-10-CM

## 2024-11-05 NOTE — Progress Notes (Signed)
 Patient no show appointment. Called and LVM to R/S.

## 2024-11-05 NOTE — Progress Notes (Deleted)
 Lucas Sherman [id number] 2001-05-22 24 y.o.  Virtual Visit via Video Note  I connected with pt @ on 11/05/24 at 10:30 AM EST by a video enabled telemedicine application and verified that I am speaking with the correct person using two identifiers.   I discussed the limitations of evaluation and management by telemedicine and the availability of in person appointments. The patient expressed understanding and agreed to proceed.  I discussed the assessment and treatment plan with the patient. The patient was provided an opportunity to ask questions and all were answered. The patient agreed with the plan and demonstrated an understanding of the instructions.   The patient was advised to call back or seek an in-person evaluation if the symptoms worsen or if the condition fails to improve as anticipated.  I provided *** minutes of non-face-to-face time during this encounter.  The patient was located at home.  The provider was located at [facility].   Angeline LOISE Sayers, NP   Subjective:   Patient ID:  Lucas Sherman is a 24 y.o. (DOB [birth year]) male.  Chief Complaint: No chief complaint on file.   HPI Lucas Sherman presents for follow-up of ADHD, Panic disorder, RLS, Depression, GAD.  Describes mood today as ok. Pleasant. Denies tearfulness. Mood symptoms - denies depression, anxiety and irritability. Reports stable interest and motivation. Reports panic episodes - coping better. Denies worry, rumination and over thinking. Denies obsessive thoughts or acts. Reports mood is stable. Stating I feel like I'm doing alright. Feels like medications are helpful. Taking medications as presribed.  Energy levels stable. Active, has a regular exercise routine.   Enjoys some usual interests and activities. Single. Spending time with family. Appetite adequate. Weight stable - 178 pounds - 68. Sleeps well most nights. Averages 7 to 8 or more hours. Reports focus and concentration  difficulties at times. Completing tasks. Managing aspects of household. Taking college classes. Playing baseball - [employer]. Denies SI or HI.  Denies AH or VH. Denies self harm. Denies substance use.   Previous medication trials: Denies    Review of Systems:  Review of Systems  Musculoskeletal:  Negative for gait problem.  Neurological:  Negative for tremors.  Psychiatric/Behavioral:         Please refer to HPI    Medications: {medication reviewed/display:3041432}  Current Outpatient Medications  Medication Sig Dispense Refill   amphetamine -dextroamphetamine  (ADDERALL) 10 MG tablet Take 1 tablet (10 mg total) by mouth daily. 30 tablet 0   amphetamine -dextroamphetamine  (ADDERALL) 10 MG tablet Take 1 tablet (10 mg total) by mouth daily. 30 tablet 0   amphetamine -dextroamphetamine  (ADDERALL) 10 MG tablet Take 1 tablet (10 mg total) by mouth daily. 30 tablet 0   clonazePAM  (KLONOPIN ) 1 MG tablet TAKE 1 TABLET BY MOUTH TWICE A DAY 60 tablet 2   escitalopram  (LEXAPRO ) 20 MG tablet TAKE 1 TABLET BY MOUTH EVERY DAY 90 tablet 1   gabapentin  (NEURONTIN ) 100 MG capsule TAKE 2 CAPSULES BY MOUTH TWICE A DAY 120 capsule 2   lisdexamfetamine  (VYVANSE ) 50 MG capsule Take 1 capsule (50 mg total) by mouth daily after breakfast. 30 capsule 0   lisdexamfetamine  (VYVANSE ) 50 MG capsule Take 1 capsule (50 mg total) by mouth daily after breakfast. 30 capsule 0   lisdexamfetamine  (VYVANSE ) 50 MG capsule Take 1 capsule (50 mg total) by mouth daily after breakfast. 30 capsule 0   No current facility-administered medications for this visit.    Medication Side Effects: {Medication Side Effects (Optional):21014029}  Allergies:  Allergies[1]  Past Medical History:  Diagnosis Date   Anxiety    Attention deficit hyperactivity disorder (ADHD), combined type, moderate 04/12/2020   COVID-19    Persistent depressive disorder with atypical features, currently mild 03/08/2020   Persistent depressive  disorder with atypical features, currently mild 03/08/2020    Family History  Problem Relation Age of Onset   Depression Mother    Anxiety disorder Mother    Depression Maternal Grandmother    Anxiety disorder Brother    Panic disorder Brother    Depression Other    Alcohol abuse Other    Arthritis Neg Hx    Asthma Neg Hx    Birth defects Neg Hx    Cancer Neg Hx    COPD Neg Hx    Diabetes Neg Hx    Drug abuse Neg Hx    Early death Neg Hx    Hearing loss Neg Hx    Heart disease Neg Hx    Hyperlipidemia Neg Hx    Learning disabilities Neg Hx    Kidney disease Neg Hx    Hypertension Neg Hx    Mental illness Neg Hx    Miscarriages / Stillbirths Neg Hx    Mental retardation Neg Hx    Stroke Neg Hx    Vision loss Neg Hx    Varicose Veins Neg Hx     Social History   Socioeconomic History   Marital status: Single    Spouse name: Not on file   Number of children: Not on file   Years of education: 14   Highest education level: High school graduate  Occupational History   Occupation: Seeking pro [email or web address]  Tobacco Use   Smoking status: Never   Smokeless tobacco: Former  Building Services Engineer status: Never Used  Substance and Sexual Activity   Alcohol use: Not Currently    Comment: occasional   Drug use: Not Currently    Types: Marijuana   Sexual activity: Not on file  Other Topics Concern   Not on file  Social History Narrative   Scientist, 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  He describes restless legs in his sleep patterns having to get up for a hot bath or stretches to his legs in order to try to get back to  sleep.  Despite longstanding Lexapro  and new onset Klonopin , he fears and finds instability still in his illness pattern that undermines his performance in baseball and academics.  He leaves for college 05/18/2019 seeking intervention prior to then after PCP has recently started the Klonopin  which helped but affecting tolerance side effects if dosed higher to prevent anxiety or panic.   Social Drivers of Health   Tobacco Use: Low Risk (10/23/2024)   Received from [facility]   Patient History    Smoking Tobacco Use: Never    Smokeless Tobacco Use: Never    Passive Exposure: Not on file  Recent Concern: Tobacco Use - Medium Risk (08/05/2024)   Patient History    Smoking Tobacco Use: Never    Smokeless Tobacco Use: Former    Passive Exposure: Not on Actuary Strain: Not on file  Food Insecurity: Not on file  Transportation Needs: Not on file  Physical Activity: Not on file  Stress: Not on file  Social Connections: Unknown (11/08/2022)   Received from [facility]   Social Network    Social Network: Not on file  Intimate Partner Violence: Not At Risk (10/23/2024)   Received from [facility]   HITS    Over the last 12 months how often did your partner scream or curse at you?: Never    Over the last 12 months how often did your partner physically hurt you?: Never    Over the last 12 months how often did your partner insult you or talk down to you?: Never    Over the last 12 months how often did your partner threaten you with physical harm?: Never  Depression (PHQ2-9): Not on file  Alcohol Screen: Not on file  Housing: Not on file  Utilities: Not on file  Health Literacy: Not on file    Past Medical History, Surgical history, Social history, and Family history were reviewed and updated as appropriate.   Please see review of systems for further details on the patient's review from today.   Objective:   Physical Exam:  There were no vitals taken for this  visit.  Physical Exam Constitutional:      General: He is not in acute distress. Musculoskeletal:        General: No deformity.  Neurological:     Mental Status: He is alert and oriented to person, place, and time.     Coordination: Coordination normal.  Psychiatric:        Attention and Perception: Attention and perception normal. He does not perceive auditory or visual hallucinations.        Mood and Affect: Mood normal. Mood is not anxious or depressed. Affect is not labile, blunt, angry or inappropriate.        Speech: Speech normal.        Behavior: Behavior normal.        Thought Content: Thought content normal. Thought content is not paranoid or delusional. Thought content does not include homicidal or suicidal ideation. Thought content does not include homicidal or suicidal plan.        Cognition and Memory: Cognition and memory normal.        Judgment: Judgment normal.     Comments: Insight intact     Lab Review:     Component Value Date/Time   NA 138 05/09/2019 1329   K 3.9 05/09/2019 1329   CL 103 05/09/2019 1329   CO2 24 05/09/2019 1329   GLUCOSE 90 05/09/2019 1329   BUN 10 05/09/2019 1329   CREATININE 1.23 05/09/2019 1329   CALCIUM 9.6 05/09/2019 1329   PROT 7.4 04/08/2019 1836   ALBUMIN 4.6 04/08/2019 1836   AST 29 04/08/2019 1836   ALT 57 (H) 04/08/2019 1836   ALKPHOS 51 04/08/2019 1836   BILITOT 0.7 04/08/2019 1836   GFRNONAA >60 05/09/2019 1329   GFRAA >60 05/09/2019 1329       Component Value Date/Time   WBC 5.4 05/09/2019 1329   RBC 5.57 05/09/2019 1329   HGB 16.1 05/09/2019 1329   HCT 49.2 05/09/2019 1329   PLT 297 05/09/2019 1329   MCV 88.3 05/09/2019 1329   MCH 28.9 05/09/2019 1329   MCHC 32.7 05/09/2019 1329   RDW 13.2 05/09/2019 1329   LYMPHSABS 1.1 04/08/2019 1836   MONOABS 0.6 04/08/2019 1836   EOSABS 0.0 04/08/2019 1836   BASOSABS 0.0 04/08/2019 1836  No results found for: POCLITH, LITHIUM   No results found for:  PHENYTOIN, PHENOBARB, VALPROATE, CBMZ   .res Assessment: Plan:    Plan:  PDMP reviewed  Continue: Vyvanse  50mg  daily Gabapentin  200mg  BID Lexapro  20mg  daily Clonazepam  - 1mg  BID - brand name Propranolol  10mg  twice daily as needed Adderall 10mg  daily as needed  25 minutes spent dedicated to the care of this patient on the date of this encounter to include pre-visit review of records, ordering of medication, post visit documentation, and face-to-face time with the patient discussing depression, anxiety, panic disorder and ADD. Discussed adding  Propranolol  to help manage anxiety.  Monitor BP between visits while taking stimulant medication.   RTC 3 months  Patient advised to contact office with any questions, adverse effects, or acute worsening in signs and symptoms.  Discussed potential benefits, risk, and side effects of benzodiazepines to include potential risk of tolerance and dependence, as well as possible drowsiness.  Advised patient not to drive if experiencing drowsiness and to take lowest possible effective dose to minimize risk of dependence and tolerance.  Discussed potential benefits, risks, and side effects of stimulants with patient to include increased heart rate, palpitations, insomnia, increased anxiety, increased irritability, or decreased appetite.  Instructed patient to contact office if experiencing any significant tolerability issues. There are no diagnoses linked to this encounter.   Please see After Visit Summary for patient specific instructions.  Future Appointments  Date Time Provider Department Center  11/05/2024 10:30 AM Vedant Shehadeh Nattalie, NP [facility] None    No orders of the defined types were placed in this encounter.     -------------------------------      [1]  Allergies Allergen Reactions   Metoclopramide  Anxiety    anxiety
